# Patient Record
Sex: Female | Born: 1935 | Race: White | Hispanic: No | Smoking: Current every day smoker
Health system: Southern US, Community
[De-identification: ages and names within clinical notes are randomized; demographics above are authoritative.]

## PROBLEM LIST (undated history)

## (undated) DIAGNOSIS — E039 Hypothyroidism, unspecified: Secondary | ICD-10-CM

## (undated) DIAGNOSIS — I10 Essential (primary) hypertension: Secondary | ICD-10-CM

## (undated) DIAGNOSIS — R413 Other amnesia: Secondary | ICD-10-CM

## (undated) DIAGNOSIS — E785 Hyperlipidemia, unspecified: Secondary | ICD-10-CM

## (undated) DIAGNOSIS — Z720 Tobacco use: Secondary | ICD-10-CM

## (undated) DIAGNOSIS — J449 Chronic obstructive pulmonary disease, unspecified: Secondary | ICD-10-CM

## (undated) DIAGNOSIS — E538 Deficiency of other specified B group vitamins: Secondary | ICD-10-CM

## (undated) HISTORY — DX: Chronic obstructive pulmonary disease, unspecified: J44.9

## (undated) HISTORY — DX: Essential (primary) hypertension: I10

## (undated) HISTORY — DX: Other amnesia: R41.3

## (undated) HISTORY — DX: Hyperlipidemia, unspecified: E78.5

## (undated) HISTORY — PX: THYROIDECTOMY: SHX17

## (undated) HISTORY — DX: Hypothyroidism, unspecified: E03.9

## (undated) HISTORY — DX: Deficiency of other specified B group vitamins: E53.8

## (undated) HISTORY — DX: Tobacco use: Z72.0

---

## 1998-11-27 ENCOUNTER — Encounter: Payer: Self-pay | Admitting: Surgery

## 1998-11-28 ENCOUNTER — Inpatient Hospital Stay (HOSPITAL_COMMUNITY): Admission: RE | Admit: 1998-11-28 | Discharge: 1998-11-29 | Payer: Self-pay | Admitting: Surgery

## 2002-02-16 ENCOUNTER — Emergency Department (HOSPITAL_COMMUNITY): Admission: EM | Admit: 2002-02-16 | Discharge: 2002-02-17 | Payer: Self-pay | Admitting: Emergency Medicine

## 2002-02-16 ENCOUNTER — Encounter: Payer: Self-pay | Admitting: Emergency Medicine

## 2004-09-26 LAB — HM MAMMOGRAPHY

## 2004-09-30 ENCOUNTER — Encounter: Admission: RE | Admit: 2004-09-30 | Discharge: 2004-09-30 | Payer: Self-pay | Admitting: Family Medicine

## 2004-10-02 ENCOUNTER — Encounter: Admission: RE | Admit: 2004-10-02 | Discharge: 2004-10-02 | Payer: Self-pay | Admitting: Family Medicine

## 2011-01-16 ENCOUNTER — Encounter: Payer: Self-pay | Admitting: Family Medicine

## 2011-07-07 ENCOUNTER — Encounter: Payer: Self-pay | Admitting: Family Medicine

## 2011-07-07 DIAGNOSIS — I10 Essential (primary) hypertension: Secondary | ICD-10-CM | POA: Insufficient documentation

## 2011-07-07 DIAGNOSIS — E039 Hypothyroidism, unspecified: Secondary | ICD-10-CM | POA: Insufficient documentation

## 2013-05-28 ENCOUNTER — Other Ambulatory Visit: Payer: Self-pay | Admitting: Family Medicine

## 2014-04-22 ENCOUNTER — Ambulatory Visit: Payer: Self-pay | Admitting: Family Medicine

## 2014-10-10 ENCOUNTER — Encounter: Payer: Self-pay | Admitting: Family Medicine

## 2014-10-11 ENCOUNTER — Encounter: Payer: Self-pay | Admitting: Family Medicine

## 2014-10-14 ENCOUNTER — Other Ambulatory Visit: Payer: Medicare Other

## 2014-10-14 DIAGNOSIS — E039 Hypothyroidism, unspecified: Secondary | ICD-10-CM

## 2014-10-14 DIAGNOSIS — I10 Essential (primary) hypertension: Secondary | ICD-10-CM

## 2014-10-14 DIAGNOSIS — E785 Hyperlipidemia, unspecified: Secondary | ICD-10-CM

## 2014-10-14 DIAGNOSIS — Z79899 Other long term (current) drug therapy: Secondary | ICD-10-CM

## 2014-10-14 DIAGNOSIS — E119 Type 2 diabetes mellitus without complications: Secondary | ICD-10-CM

## 2014-10-14 LAB — COMPREHENSIVE METABOLIC PANEL
ALT: 9 U/L (ref 0–35)
AST: 22 U/L (ref 0–37)
Albumin: 4.2 g/dL (ref 3.5–5.2)
Alkaline Phosphatase: 64 U/L (ref 39–117)
BILIRUBIN TOTAL: 0.4 mg/dL (ref 0.2–1.2)
BUN: 15 mg/dL (ref 6–23)
CO2: 26 meq/L (ref 19–32)
CREATININE: 1.13 mg/dL — AB (ref 0.50–1.10)
Calcium: 8.9 mg/dL (ref 8.4–10.5)
Chloride: 105 mEq/L (ref 96–112)
Glucose, Bld: 104 mg/dL — ABNORMAL HIGH (ref 70–99)
Potassium: 4.7 mEq/L (ref 3.5–5.3)
Sodium: 141 mEq/L (ref 135–145)
Total Protein: 7.1 g/dL (ref 6.0–8.3)

## 2014-10-14 LAB — CBC WITH DIFFERENTIAL/PLATELET
BASOS PCT: 1 % (ref 0–1)
Basophils Absolute: 0.1 10*3/uL (ref 0.0–0.1)
EOS ABS: 0.1 10*3/uL (ref 0.0–0.7)
EOS PCT: 2 % (ref 0–5)
HCT: 44.4 % (ref 36.0–46.0)
Hemoglobin: 14.7 g/dL (ref 12.0–15.0)
LYMPHS ABS: 2.9 10*3/uL (ref 0.7–4.0)
Lymphocytes Relative: 44 % (ref 12–46)
MCH: 33 pg (ref 26.0–34.0)
MCHC: 33.1 g/dL (ref 30.0–36.0)
MCV: 99.6 fL (ref 78.0–100.0)
Monocytes Absolute: 0.3 10*3/uL (ref 0.1–1.0)
Monocytes Relative: 4 % (ref 3–12)
Neutro Abs: 3.2 10*3/uL (ref 1.7–7.7)
Neutrophils Relative %: 49 % (ref 43–77)
PLATELETS: 204 10*3/uL (ref 150–400)
RBC: 4.46 MIL/uL (ref 3.87–5.11)
RDW: 14 % (ref 11.5–15.5)
WBC: 6.5 10*3/uL (ref 4.0–10.5)

## 2014-10-14 LAB — LIPID PANEL
CHOL/HDL RATIO: 3.2 ratio
Cholesterol: 247 mg/dL — ABNORMAL HIGH (ref 0–200)
HDL: 77 mg/dL (ref >39)
LDL CALC: 142 mg/dL — AB (ref 0–99)
Triglycerides: 138 mg/dL (ref <150)
VLDL: 28 mg/dL (ref 0–40)

## 2014-10-14 LAB — HEMOGLOBIN A1C
Hgb A1c MFr Bld: 6.5 % — ABNORMAL HIGH (ref <5.7)
Mean Plasma Glucose: 140 mg/dL — ABNORMAL HIGH (ref <117)

## 2014-10-14 LAB — TSH: TSH: 57.086 u[IU]/mL — AB (ref 0.350–4.500)

## 2014-10-17 ENCOUNTER — Ambulatory Visit (INDEPENDENT_AMBULATORY_CARE_PROVIDER_SITE_OTHER): Payer: Medicare Other | Admitting: Family Medicine

## 2014-10-17 ENCOUNTER — Encounter: Payer: Self-pay | Admitting: Family Medicine

## 2014-10-17 VITALS — BP 180/100 | HR 80 | Temp 98.1°F | Resp 20 | Wt 151.0 lb

## 2014-10-17 DIAGNOSIS — Z0000 Encounter for general adult medical examination without abnormal findings: Secondary | ICD-10-CM

## 2014-10-17 DIAGNOSIS — Z23 Encounter for immunization: Secondary | ICD-10-CM

## 2014-10-17 DIAGNOSIS — E038 Other specified hypothyroidism: Secondary | ICD-10-CM

## 2014-10-17 DIAGNOSIS — E785 Hyperlipidemia, unspecified: Secondary | ICD-10-CM

## 2014-10-17 DIAGNOSIS — I10 Essential (primary) hypertension: Secondary | ICD-10-CM

## 2014-10-17 DIAGNOSIS — E119 Type 2 diabetes mellitus without complications: Secondary | ICD-10-CM

## 2014-10-17 DIAGNOSIS — IMO0001 Reserved for inherently not codable concepts without codable children: Secondary | ICD-10-CM

## 2014-10-17 DIAGNOSIS — J449 Chronic obstructive pulmonary disease, unspecified: Secondary | ICD-10-CM | POA: Insufficient documentation

## 2014-10-17 MED ORDER — SIMVASTATIN 20 MG PO TABS: 20.0000 mg | ORAL_TABLET | Freq: Every day | ORAL | Status: DC

## 2014-10-17 MED ORDER — TIOTROPIUM BROMIDE MONOHYDRATE 18 MCG IN CAPS: ORAL_CAPSULE | RESPIRATORY_TRACT | Status: DC

## 2014-10-17 MED ORDER — LEVOTHYROXINE SODIUM 88 MCG PO TABS: 88.0000 ug | ORAL_TABLET | Freq: Every day | ORAL | Status: DC

## 2014-10-17 NOTE — Addendum Note (Signed)
Addended by: Legrand RamsWILLIS, SANDY B on: 10/17/2014 01:00 PM   Modules accepted: Orders

## 2014-10-17 NOTE — Progress Notes (Signed)
Subjective:    Patient ID: Taylor Sawyer, female    DOB: 31-Jul-1930, 80 y.o.   MRN: [id number]  HPI Patient has not been seen in 2 years. She is coming today but of her daughters. One daughter has obtained power of attorney. The patient has not been on medication in over 2 years. She has a past medical history of diabetes mellitus type 2, hypertension, hyperlipidemia, hypothyroidism, and COPD. She continues to smoke. She is overdue for a mammogram, colonoscopy, and Pap smear. She is also due for Prevnar 13 as well as a flu shot. Patient unfortunately recently lost her home. She also recently lost a relative. She is very distraught and nervous today. Her family is convinced that this is the reason her blood pressure is elevated. I rechecked her blood pressure after she had sat for 30 minutes, and her blood pressure has fallen to 170/90 but still significantly elevated.  Her most recent lab work is listed below: Lab on 10/14/2014  Component Date Value Ref Range Status  . WBC 10/14/2014 6.5  4.0 - 10.5 K/uL Final  . RBC 10/14/2014 4.46  3.87 - 5.11 MIL/uL Final  . Hemoglobin 10/14/2014 14.7  12.0 - 15.0 g/dL Final  . HCT 10/14/2014 44.4  36.0 - 46.0 % Final  . MCV 10/14/2014 99.6  78.0 - 100.0 fL Final  . MCH 10/14/2014 33.0  26.0 - 34.0 pg Final  . MCHC 10/14/2014 33.1  30.0 - 36.0 g/dL Final  . RDW 10/14/2014 14.0  11.5 - 15.5 % Final  . Platelets 10/14/2014 204  150 - 400 K/uL Final  . Neutrophils Relative % 10/14/2014 49  43 - 77 % Final  . Neutro Abs 10/14/2014 3.2  1.7 - 7.7 K/uL Final  . Lymphocytes Relative 10/14/2014 44  12 - 46 % Final  . Lymphs Abs 10/14/2014 2.9  0.7 - 4.0 K/uL Final  . Monocytes Relative 10/14/2014 4  3 - 12 % Final  . Monocytes Absolute 10/14/2014 0.3  0.1 - 1.0 K/uL Final  . Eosinophils Relative 10/14/2014 2  0 - 5 % Final  . Eosinophils Absolute 10/14/2014 0.1  0.0 - 0.7 K/uL Final  . Basophils Relative 10/14/2014 1  0 - 1 % Final  . Basophils Absolute  10/14/2014 0.1  0.0 - 0.1 K/uL Final  . Smear Review 10/14/2014 Criteria for review not met   Final  . Sodium 10/14/2014 141  135 - 145 mEq/L Final  . Potassium 10/14/2014 4.7  3.5 - 5.3 mEq/L Final  . Chloride 10/14/2014 105  96 - 112 mEq/L Final  . CO2 10/14/2014 26  19 - 32 mEq/L Final  . Glucose, Bld 10/14/2014 104* 70 - 99 mg/dL Final  . BUN 10/14/2014 15  6 - 23 mg/dL Final  . Creat 10/14/2014 1.13* 0.50 - 1.10 mg/dL Final  . Total Bilirubin 10/14/2014 0.4  0.2 - 1.2 mg/dL Final  . Alkaline Phosphatase 10/14/2014 64  39 - 117 U/L Final  . AST 10/14/2014 22  0 - 37 U/L Final  . ALT 10/14/2014 9  0 - 35 U/L Final  . Total Protein 10/14/2014 7.1  6.0 - 8.3 g/dL Final  . Albumin 10/14/2014 4.2  3.5 - 5.2 g/dL Final  . Calcium 10/14/2014 8.9  8.4 - 10.5 mg/dL Final  . Hemoglobin A1C 10/14/2014 6.5* <5.7 % Final   Comment:  According to the ADA Clinical Practice Recommendations for 2011, when                          HbA1c is used as a screening test:                                                       >=6.5%   Diagnostic of Diabetes Mellitus                                     (if abnormal result is confirmed)                                                     5.7-6.4%   Increased risk of developing Diabetes Mellitus                                                     References:Diagnosis and Classification of Diabetes Mellitus,Diabetes                          ZYSA,6301,60(FUXNA 1):S62-S69 and Standards of Medical Care in                                  Diabetes - 2011,Diabetes TFTD,3220,25 (Suppl 1):S11-S61.                             . Mean Plasma Glucose 10/14/2014 140* <117 mg/dL Final  . Cholesterol 10/14/2014 247* 0 - 200 mg/dL Final   Comment: ATP III Classification:                                < 200        mg/dL        Desirable                               200 - 239      mg/dL        Borderline High                               >= 240        mg/dL        High                             . Triglycerides 10/14/2014 138  <150 mg/dL Final  . HDL 10/14/2014 77  >39 mg/dL Final  . Total CHOL/HDL Ratio 10/14/2014 3.2   Final  . VLDL 10/14/2014 28  0 - 40 mg/dL Final  .  LDL Cholesterol 10/14/2014 142* 0 - 99 mg/dL Final   Comment:                            Total Cholesterol/HDL Ratio:CHD Risk                                                 Coronary Heart Disease Risk Table                                                                 Men       Women                                   1/2 Average Risk              3.4        3.3                                       Average Risk              5.0        4.4                                    2X Average Risk              9.6        7.1                                    3X Average Risk             23.4       11.0                          Use the calculated Patient Ratio above and the CHD Risk table                           to determine the patient's CHD Risk.                          ATP III Classification (LDL):                                < 100        mg/dL         Optimal                               100 - 129     mg/dL  Near or Above Optimal                               130 - 159     mg/dL         Borderline High                               160 - 189     mg/dL         High                                > 190        mg/dL         Very High                             . TSH 10/14/2014 57.086* 0.350 - 4.500 uIU/mL Final   Past Medical History  Diagnosis Date  . Asthma   . Diabetes mellitus   . Hypertension   . Hypothyroidism   . Tobacco user   . Hyperlipidemia   . COPD (chronic obstructive pulmonary disease)    Past Surgical History  Procedure Laterality Date  . Thyroidectomy     Current Outpatient Prescriptions on File Prior to Visit  Medication Sig Dispense Refill  .  Fluticasone-Salmeterol (ADVAIR DISKUS) 250-50 MCG/DOSE AEPB Inhale 1 puff into the lungs every 12 (twelve) hours.        Marland Kitchen glipiZIDE (GLUCOTROL) 10 MG tablet Take 10 mg by mouth 2 (two) times daily before a meal.        . hydrochlorothiazide 25 MG tablet Take 25 mg by mouth daily.        Marland Kitchen HYDROcodone-acetaminophen (VICODIN) 5-500 MG per tablet Take 1 tablet by mouth every 6 (six) hours as needed.        Marland Kitchen lisinopril (PRINIVIL,ZESTRIL) 20 MG tablet Take 20 mg by mouth daily.        . metFORMIN (GLUMETZA) 1000 MG (MOD) 24 hr tablet Take 1,000 mg by mouth 2 (two) times daily with a meal.        . pioglitazone (ACTOS) 15 MG tablet Take 15 mg by mouth daily.        . traMADol (ULTRAM) 50 MG tablet Take 50 mg by mouth every 6 (six) hours as needed.         No current facility-administered medications on file prior to visit.   Allergies  Allergen Reactions  . Prednisone     Cramps.   History   Social History  . Marital Status: Widowed    Spouse Name: N/A    Number of Children: N/A  . Years of Education: N/A   Occupational History  . Not on file.   Social History Main Topics  . Smoking status: Current Every Day Smoker  . Smokeless tobacco: Not on file  . Alcohol Use: Not on file  . Drug Use: Not on file  . Sexual Activity: Not on file   Other Topics Concern  . Not on file   Social History Narrative  . No narrative on file   No family history on file.    Review of Systems  All other systems reviewed and are negative.      Objective:   Physical Exam  Vitals reviewed. Constitutional: She is oriented to person, place, and time. She appears well-developed and well-nourished. No distress.  HENT:  Head: Normocephalic and atraumatic.  Right Ear: External ear normal.  Left Ear: External ear normal.  Nose: Nose normal.  Mouth/Throat: Oropharynx is clear and moist. No oropharyngeal exudate.  Eyes: Conjunctivae and EOM are normal. Pupils are equal, round, and reactive to  light. Right eye exhibits no discharge. Left eye exhibits no discharge. No scleral icterus.  Neck: Normal range of motion. Neck supple. No JVD present. No thyromegaly present.  Cardiovascular: Normal rate, regular rhythm, normal heart sounds and intact distal pulses.   No murmur heard. Pulmonary/Chest: Effort normal. No respiratory distress. She has wheezes. She has no rales. She exhibits no tenderness.  Abdominal: Soft. Bowel sounds are normal. She exhibits no distension and no mass. There is no tenderness. There is no rebound and no guarding.  Musculoskeletal: Normal range of motion. She exhibits no edema and no tenderness.  Lymphadenopathy:    She has no cervical adenopathy.  Neurological: She is alert and oriented to person, place, and time. She has normal reflexes. She displays normal reflexes. No cranial nerve deficit. She exhibits normal muscle tone. Coordination normal.  Skin: Skin is warm. No rash noted. She is not diaphoretic. No erythema. No pallor.  Psychiatric: She has a normal mood and affect. Her behavior is normal. Judgment and thought content normal.          Assessment & Plan:  Routine general medical examination at a health care facility - Plan: MM Digital Screening  Essential hypertension  Diabetes mellitus type II, non insulin dependent  Other specified hypothyroidism  COPD bronchitis  HLD (hyperlipidemia)  Patient's blood pressure is extremely high. The family would like to try checking her blood pressure at home today. They will call me with the results later today or tomorrow. If her blood pressure remains elevated, I would resume hydrochlorothiazide along with losartan. I would then recheck blood pressure in one month. Patient's TSH is significantly elevated. Resume levothyroxine 88 mcg by mouth daily and recheck a TSH in 8 weeks. Hemoglobin A1c is acceptable. Patient currently is diet controlled and does not require medication. Her LDL cholesterol  significantly high. I will start the patient on simvastatin 20 mg by mouth daily and recheck a fasting lipid panel in 3 months. Patient will resume spiriva 1 inhalation daily.  I will also schedule patient for mammogram. Patient is 84 years on and has never had a bad Pap smear. Therefore according to current guidelines, she can discontinue Pap smears. She also does not want to have a colonoscopy given her age. The patient received the flu vaccine as well as Prevnar 13 today

## 2014-10-18 ENCOUNTER — Encounter: Payer: Self-pay | Admitting: *Deleted

## 2014-10-25 ENCOUNTER — Telehealth: Payer: Self-pay | Admitting: Family Medicine

## 2014-10-25 MED ORDER — LOSARTAN POTASSIUM-HCTZ 100-12.5 MG PO TABS: 1.0000 | ORAL_TABLET | Freq: Every day | ORAL | Status: DC

## 2014-10-25 NOTE — Telephone Encounter (Signed)
Begin hyzaar 100/12.5 poqday and recheck here in 2 weeks.

## 2014-10-25 NOTE — Telephone Encounter (Signed)
Patients poa is calling let us know that her blood pressure is still very high and would like to know what she should do [phone]

## 2014-10-25 NOTE — Telephone Encounter (Signed)
Pt is not currently taking any BP med - Pt's dtr is aware and [facility]

## 2014-11-07 ENCOUNTER — Ambulatory Visit (INDEPENDENT_AMBULATORY_CARE_PROVIDER_SITE_OTHER): Payer: Medicare Other | Admitting: Family Medicine

## 2014-11-07 ENCOUNTER — Encounter: Payer: Self-pay | Admitting: Family Medicine

## 2014-11-07 ENCOUNTER — Ambulatory Visit
Admission: RE | Admit: 2014-11-07 | Discharge: 2014-11-07 | Disposition: A | Discharge status: Home / Self Care | Payer: Medicare Other | Source: Ambulatory Visit | Attending: Family Medicine | Admitting: Family Medicine

## 2014-11-07 VITALS — BP 128/74 | HR 84 | Temp 98.3°F | Resp 16

## 2014-11-07 DIAGNOSIS — M25561 Pain in right knee: Secondary | ICD-10-CM

## 2014-11-07 DIAGNOSIS — I10 Essential (primary) hypertension: Secondary | ICD-10-CM
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
## 2014-11-08 ENCOUNTER — Telehealth: Payer: Self-pay | Admitting: Family Medicine

## 2014-11-08 NOTE — Telephone Encounter (Signed)
[phone]  Pt daughter is calling to get the xray results of her mothers knee    Pt is needing something for pain

## 2014-11-08 NOTE — Telephone Encounter (Signed)
[phone]  Pt daughter is calling to get results of xray And she states that her mother is needing something for pain

## 2014-11-08 NOTE — Telephone Encounter (Signed)
Pt's dtr aware - See imaging note

## 2014-11-14 ENCOUNTER — Ambulatory Visit: Payer: Self-pay

## 2014-12-03 ENCOUNTER — Telehealth: Payer: Self-pay | Admitting: *Deleted
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
## 2014-12-17 ENCOUNTER — Other Ambulatory Visit: Payer: Self-pay | Admitting: Family Medicine

## 2014-12-17 NOTE — Telephone Encounter (Signed)
Ok to refill??  Last office visit/ refill 11/07/2014. 

## 2014-12-17 NOTE — Telephone Encounter (Signed)
denied °

## 2014-12-23 ENCOUNTER — Encounter: Payer: Self-pay | Admitting: *Deleted

## 2014-12-23 NOTE — Telephone Encounter (Signed)
Denied.  NTBS if needs chronic pain meds.

## 2015-02-08 ENCOUNTER — Other Ambulatory Visit: Payer: Self-pay | Admitting: Family Medicine

## 2015-02-11 ENCOUNTER — Encounter: Payer: Self-pay | Admitting: Family Medicine

## 2015-02-17 ENCOUNTER — Ambulatory Visit: Payer: Self-pay | Admitting: Family Medicine

## 2015-02-18 ENCOUNTER — Ambulatory Visit: Payer: Self-pay | Admitting: Family Medicine

## 2015-02-25 ENCOUNTER — Other Ambulatory Visit: Payer: Self-pay | Admitting: Family Medicine

## 2015-02-27 ENCOUNTER — Ambulatory Visit: Payer: Self-pay | Admitting: Family Medicine

## 2015-03-12 ENCOUNTER — Encounter: Payer: Self-pay | Admitting: Family Medicine

## 2015-03-14 ENCOUNTER — Encounter: Payer: Self-pay | Admitting: Family Medicine

## 2015-03-14 ENCOUNTER — Other Ambulatory Visit: Payer: Self-pay | Admitting: Family Medicine

## 2015-03-14 ENCOUNTER — Ambulatory Visit (INDEPENDENT_AMBULATORY_CARE_PROVIDER_SITE_OTHER): Payer: Medicare Other | Admitting: Family Medicine

## 2015-03-14 VITALS — BP 146/70 | HR 68 | Temp 97.9°F | Resp 18 | Wt 158.0 lb

## 2015-03-14 DIAGNOSIS — E038 Other specified hypothyroidism: Secondary | ICD-10-CM

## 2015-03-14 DIAGNOSIS — I10 Essential (primary) hypertension: Secondary | ICD-10-CM

## 2015-03-14 DIAGNOSIS — E119 Type 2 diabetes mellitus without complications: Secondary | ICD-10-CM | POA: Diagnosis not present

## 2015-03-14 DIAGNOSIS — M25561 Pain in right knee: Secondary | ICD-10-CM

## 2015-03-14 LAB — COMPLETE METABOLIC PANEL WITH GFR
ALBUMIN: 3.8 g/dL (ref 3.5–5.2)
ALK PHOS: 77 U/L (ref 39–117)
ALT: 8 U/L (ref 0–35)
AST: 17 U/L (ref 0–37)
BILIRUBIN TOTAL: 0.3 mg/dL (ref 0.2–1.2)
BUN: 27 mg/dL — ABNORMAL HIGH (ref 6–23)
CO2: 26 meq/L (ref 19–32)
Calcium: 9.2 mg/dL (ref 8.4–10.5)
Chloride: 103 mEq/L (ref 96–112)
Creat: 1.13 mg/dL — ABNORMAL HIGH (ref 0.50–1.10)
GFR, EST NON AFRICAN AMERICAN: 45 mL/min — AB
GFR, Est African American: 52 mL/min — ABNORMAL LOW
GLUCOSE: 126 mg/dL — AB (ref 70–99)
POTASSIUM: 4.5 meq/L (ref 3.5–5.3)
Sodium: 140 mEq/L (ref 135–145)
Total Protein: 7.6 g/dL (ref 6.0–8.3)

## 2015-03-14 LAB — LIPID PANEL
Cholesterol: 175 mg/dL (ref 0–200)
HDL: 67 mg/dL (ref >=46)
LDL Cholesterol: 75 mg/dL (ref 0–99)
TRIGLYCERIDES: 167 mg/dL — AB (ref <150)
Total CHOL/HDL Ratio: 2.6 Ratio
VLDL: 33 mg/dL (ref 0–40)

## 2015-03-14 NOTE — Progress Notes (Signed)
Subjective:    Patient ID: Taylor Sawyer, female    DOB: 1930/09/23, 81 y.o.   MRN: [id number]  HPI Patient is here today for follow-up of her diabetes, hypertension, and hypothyroidism, and hyperlipidemia. Her blood pressure today is slightly elevated. However given her age a believe this is acceptable. They're not checking her blood pressure at home. She denies any chest pain shortness of breath or dyspnea on exertion. She denies any myalgias or right upper quadrant pain on simvastatin. She is due to check a fasting lipid panel. She is also due to recheck a TSH for hypothyroidism. When I last saw the patient in October, her TSH was almost 60. We started the patient on levothyroxine 88 g by mouth daily and she is due to recheck that today. She is also complaining of pain in her right knee. She has osteoarthritis of the right knee. She has a difficult time walking. Arthritis drugs and tramadol have been no benefit. Past Medical History  Diagnosis Date  . Asthma   . Diabetes mellitus   . Hypertension   . Hypothyroidism   . Tobacco user   . Hyperlipidemia   . COPD (chronic obstructive pulmonary disease)    Past Surgical History  Procedure Laterality Date  . Thyroidectomy     Current Outpatient Prescriptions on File Prior to Visit  Medication Sig Dispense Refill  . levothyroxine (SYNTHROID, LEVOTHROID) 88 MCG tablet TAKE 1 TABLET (88 MCG TOTAL) BY MOUTH DAILY. 30 tablet 3  . losartan-hydrochlorothiazide (HYZAAR) 100-12.5 MG per tablet TAKE 1 TABLET BY MOUTH DAILY. 30 tablet 5  . simvastatin (ZOCOR) 20 MG tablet TAKE 1 TABLET (20 MG TOTAL) BY MOUTH AT BEDTIME. 30 tablet 3  . tiotropium (SPIRIVA HANDIHALER) 18 MCG inhalation capsule USE 1 INHALATION ONCE DAILY AS INSTRUCTED 30 capsule 3   No current facility-administered medications on file prior to visit.   Allergies  Allergen Reactions  . Prednisone     Cramps.   History   Social History  . Marital Status: Widowed    Spouse  Name: N/A  . Number of Children: N/A  . Years of Education: N/A   Occupational History  . Not on file.   Social History Main Topics  . Smoking status: Current Every Day Smoker  . Smokeless tobacco: Not on file  . Alcohol Use: Not on file  . Drug Use: Not on file  . Sexual Activity: Not on file   Other Topics Concern  . Not on file   Social History Narrative      Review of Systems  All other systems reviewed and are negative.      Objective:   Physical Exam  Constitutional: She appears well-developed and well-nourished.  Neck: Neck supple. No thyromegaly present.  Cardiovascular: Normal rate, regular rhythm and normal heart sounds.   No murmur heard. Pulmonary/Chest: Effort normal and breath sounds normal. No respiratory distress. She has no wheezes. She has no rales. She exhibits no tenderness.  Abdominal: Soft. Bowel sounds are normal. She exhibits no distension and no mass. There is no tenderness. There is no rebound and no guarding.  Musculoskeletal: She exhibits no edema.       Right knee: She exhibits decreased range of motion. Tenderness found. Medial joint line and lateral joint line tenderness noted. No MCL and no LCL tenderness noted.  Lymphadenopathy:    She has no cervical adenopathy.  Vitals reviewed.         Assessment & Plan:  Other  specified hypothyroidism - Plan: TSH  Diabetes mellitus type II, controlled - Plan: COMPLETE METABOLIC PANEL WITH GFR, Lipid panel, Microalbumin, urine  Right knee pain  Benign essential HTN  I will check a TSH and titrate her levothyroxine to achieve a TSH in the therapeutic range. Her blood pressure is borderline today. I've asked him to check her blood pressure at home and if consistently greater than 145, I would add amlodipine. I will also check a fasting lipid panel. Goal LDL cholesterol is less than 100. I believe her right knee pain is due to osteoarthritis. Using sterile technique, I injected the right knee  with a mixture of 2 mL of lidocaine, 2 mL of Marcaine, and 2 mL of 40 mg per mL Kenalog. The patient tolerated procedure well without complication.

## 2015-03-15 LAB — TSH: TSH: 0.619 u[IU]/mL (ref 0.350–4.500)

## 2015-03-15 LAB — MICROALBUMIN, URINE: Microalb, Ur: 2 mg/dL (ref <2.0)

## 2015-03-17 LAB — HEMOGLOBIN A1C
Hgb A1c MFr Bld: 7.2 % — ABNORMAL HIGH (ref <5.7)
Mean Plasma Glucose: 160 mg/dL — ABNORMAL HIGH (ref <117)

## 2015-03-20 ENCOUNTER — Other Ambulatory Visit: Payer: Self-pay | Admitting: Family Medicine

## 2015-03-20 ENCOUNTER — Telehealth: Payer: Self-pay | Admitting: Family Medicine

## 2015-03-20 DIAGNOSIS — E1165 Type 2 diabetes mellitus with hyperglycemia: Secondary | ICD-10-CM

## 2015-03-20 DIAGNOSIS — IMO0002 Reserved for concepts with insufficient information to code with codable children: Secondary | ICD-10-CM

## 2015-03-20 MED ORDER — METFORMIN HCL 500 MG PO TABS: 500.0000 mg | ORAL_TABLET | Freq: Two times a day (BID) | ORAL | Status: DC

## 2015-03-20 NOTE — Telephone Encounter (Signed)
-----   Message from Donita BrooksWarren T Pickard, MD sent at 03/18/2015  7:15 AM EDT ----- Diabetes test is too high.  Begin metformin 500 bid. Recheck in 3 months.

## 2015-03-20 NOTE — Telephone Encounter (Signed)
LMTCB at pt home.  Rx to pharmacy

## 2015-03-20 NOTE — Telephone Encounter (Signed)
-----   Message from Donita BrooksWarren T Pickard, MD sent at 03/17/2015  6:59 AM EDT ----- Labs are excellent., Please have them add hga1c.

## 2015-04-23 ENCOUNTER — Other Ambulatory Visit: Payer: Self-pay | Admitting: Family Medicine

## 2015-04-23 DIAGNOSIS — IMO0002 Reserved for concepts with insufficient information to code with codable children: Secondary | ICD-10-CM

## 2015-04-23 DIAGNOSIS — E1165 Type 2 diabetes mellitus with hyperglycemia: Secondary | ICD-10-CM

## 2015-04-23 MED ORDER — SIMVASTATIN 20 MG PO TABS: ORAL_TABLET | ORAL | Status: DC

## 2015-04-23 MED ORDER — LEVOTHYROXINE SODIUM 88 MCG PO TABS: ORAL_TABLET | ORAL | Status: DC

## 2015-04-23 MED ORDER — LOSARTAN POTASSIUM-HCTZ 100-12.5 MG PO TABS: 1.0000 | ORAL_TABLET | Freq: Every day | ORAL | Status: DC

## 2015-04-23 MED ORDER — METFORMIN HCL 500 MG PO TABS: 500.0000 mg | ORAL_TABLET | Freq: Two times a day (BID) | ORAL | Status: DC

## 2015-06-03 ENCOUNTER — Other Ambulatory Visit: Payer: Self-pay | Admitting: Family Medicine

## 2015-06-03 NOTE — Telephone Encounter (Signed)
Refill appropriate and filled per protocol. 

## 2015-06-15 ENCOUNTER — Other Ambulatory Visit: Payer: Self-pay | Admitting: Family Medicine

## 2015-06-16 NOTE — Telephone Encounter (Signed)
Medication refilled per protocol. 

## 2015-07-10 ENCOUNTER — Ambulatory Visit: Payer: Medicare Other | Admitting: Family Medicine

## 2015-07-18 ENCOUNTER — Ambulatory Visit: Payer: Medicare Other | Admitting: Family Medicine

## 2015-07-24 ENCOUNTER — Ambulatory Visit: Payer: Medicare Other | Admitting: Family Medicine

## 2015-08-05 ENCOUNTER — Ambulatory Visit: Payer: Medicare Other | Admitting: Family Medicine

## 2015-08-16 ENCOUNTER — Other Ambulatory Visit: Payer: Self-pay | Admitting: Family Medicine

## 2015-08-26 ENCOUNTER — Ambulatory Visit (INDEPENDENT_AMBULATORY_CARE_PROVIDER_SITE_OTHER): Payer: Medicare Other | Admitting: Family Medicine

## 2015-08-26 ENCOUNTER — Encounter: Payer: Self-pay | Admitting: Family Medicine

## 2015-08-26 VITALS — BP 132/80 | HR 78 | Temp 98.4°F | Resp 16 | Wt 148.0 lb

## 2015-08-26 DIAGNOSIS — I10 Essential (primary) hypertension: Secondary | ICD-10-CM | POA: Diagnosis not present

## 2015-08-26 DIAGNOSIS — IMO0001 Reserved for inherently not codable concepts without codable children: Secondary | ICD-10-CM

## 2015-08-26 DIAGNOSIS — J449 Chronic obstructive pulmonary disease, unspecified: Secondary | ICD-10-CM

## 2015-08-26 DIAGNOSIS — E038 Other specified hypothyroidism: Secondary | ICD-10-CM

## 2015-08-26 DIAGNOSIS — E119 Type 2 diabetes mellitus without complications: Secondary | ICD-10-CM | POA: Diagnosis not present

## 2015-08-26 LAB — CBC WITH DIFFERENTIAL/PLATELET
BASOS ABS: 0.1 10*3/uL (ref 0.0–0.1)
Basophils Relative: 1 % (ref 0–1)
Eosinophils Absolute: 0.2 10*3/uL (ref 0.0–0.7)
Eosinophils Relative: 3 % (ref 0–5)
HEMATOCRIT: 38.7 % (ref 36.0–46.0)
Hemoglobin: 12.8 g/dL (ref 12.0–15.0)
LYMPHS ABS: 3 10*3/uL (ref 0.7–4.0)
Lymphocytes Relative: 40 % (ref 12–46)
MCH: 31.6 pg (ref 26.0–34.0)
MCHC: 33.1 g/dL (ref 30.0–36.0)
MCV: 95.6 fL (ref 78.0–100.0)
MPV: 9.3 fL (ref 8.6–12.4)
Monocytes Absolute: 0.5 10*3/uL (ref 0.1–1.0)
Monocytes Relative: 6 % (ref 3–12)
NEUTROS ABS: 3.8 10*3/uL (ref 1.7–7.7)
NEUTROS PCT: 50 % (ref 43–77)
PLATELETS: 240 10*3/uL (ref 150–400)
RBC: 4.05 MIL/uL (ref 3.87–5.11)
RDW: 13.9 % (ref 11.5–15.5)
WBC: 7.6 10*3/uL (ref 4.0–10.5)

## 2015-08-26 LAB — LIPID PANEL
Cholesterol: 130 mg/dL (ref 125–200)
HDL: 67 mg/dL (ref >=46)
LDL CALC: 43 mg/dL (ref <130)
TRIGLYCERIDES: 101 mg/dL (ref <150)
Total CHOL/HDL Ratio: 1.9 Ratio (ref <=5.0)
VLDL: 20 mg/dL (ref <30)

## 2015-08-26 LAB — COMPLETE METABOLIC PANEL WITH GFR
ALT: 4 U/L — AB (ref 6–29)
AST: 14 U/L (ref 10–35)
Albumin: 4 g/dL (ref 3.6–5.1)
Alkaline Phosphatase: 63 U/L (ref 33–130)
BILIRUBIN TOTAL: 0.3 mg/dL (ref 0.2–1.2)
BUN: 19 mg/dL (ref 7–25)
CALCIUM: 9 mg/dL (ref 8.6–10.4)
CO2: 25 mmol/L (ref 20–31)
Chloride: 106 mmol/L (ref 98–110)
Creat: 1.21 mg/dL — ABNORMAL HIGH (ref 0.60–0.88)
GFR, EST AFRICAN AMERICAN: 48 mL/min — AB (ref >=60)
GFR, Est Non African American: 41 mL/min — ABNORMAL LOW (ref >=60)
Glucose, Bld: 120 mg/dL — ABNORMAL HIGH (ref 70–99)
Potassium: 4.7 mmol/L (ref 3.5–5.3)
Sodium: 139 mmol/L (ref 135–146)
Total Protein: 6.7 g/dL (ref 6.1–8.1)

## 2015-08-26 LAB — TSH: TSH: 0.129 u[IU]/mL — ABNORMAL LOW (ref 0.350–4.500)

## 2015-08-26 LAB — HEMOGLOBIN A1C
HEMOGLOBIN A1C: 6 % — AB (ref <5.7)
Mean Plasma Glucose: 126 mg/dL — ABNORMAL HIGH (ref <117)

## 2015-08-26 NOTE — Progress Notes (Signed)
Subjective:    Patient ID: Taylor Sawyer, female    DOB: May 06, 1930, 81 y.o.   MRN: [id number]  HPI 03/12/15 Patient is here today for follow-up of her diabetes, hypertension, and hypothyroidism, and hyperlipidemia. Her blood pressure today is slightly elevated. However given her age a believe this is acceptable. They're not checking her blood pressure at home. She denies any chest pain shortness of breath or dyspnea on exertion. She denies any myalgias or right upper quadrant pain on simvastatin. She is due to check a fasting lipid panel. She is also due to recheck a TSH for hypothyroidism. When I last saw the patient in October, her TSH was almost 60. We started the patient on levothyroxine 88 g by mouth daily and she is due to recheck that today. She is also complaining of pain in her right knee. She has osteoarthritis of the right knee. She has a difficult time walking. Arthritis drugs and tramadol have been no benefit.  At that time, my plan was: I will check a TSH and titrate her levothyroxine to achieve a TSH in the therapeutic range. Her blood pressure is borderline today. I've asked him to check her blood pressure at home and if consistently greater than 145, I would add amlodipine. I will also check a fasting lipid panel. Goal LDL cholesterol is less than 100. I believe her right knee pain is due to osteoarthritis. Using sterile technique, I injected the right knee with a mixture of 2 mL of lidocaine, 2 mL of Marcaine, and 2 mL of 40 mg per mL Kenalog. The patient tolerated procedure well without complication.  08/26/15 She is here today for follow-up. Since I last saw her she has lost 10 pounds. This is been unintentional. Her daughter states that she just will need. Patient denies any cough. She denies any chest pain. She denies any hemoptysis. She denies any fevers or chills. She denies any abdominal pain. She denies any nausea vomiting or diarrhea area and she denies any blood in her stool.  Daughter states that she is just given up. Daughter states that she sleeps all day long. She will not be active. She will not even get up to walk around the house. Her blood pressure today is acceptable at 132/80. She continues to smoke and in fact may be smoking even more than she was previously. She denies any chest pain however. She is due to recheck her fasting lab work Past Medical History  Diagnosis Date  . Asthma   . Diabetes mellitus   . Hypertension   . Hypothyroidism   . Tobacco user   . Hyperlipidemia   . COPD (chronic obstructive pulmonary disease)    Past Surgical History  Procedure Laterality Date  . Thyroidectomy     Current Outpatient Prescriptions on File Prior to Visit  Medication Sig Dispense Refill  . levothyroxine (SYNTHROID, LEVOTHROID) 88 MCG tablet TAKE 1 TABLET (88 MCG TOTAL) BY MOUTH DAILY. 30 tablet 3  . losartan-hydrochlorothiazide (HYZAAR) 100-12.5 MG per tablet Take 1 tablet by mouth daily. 90 tablet 1  . metFORMIN (GLUCOPHAGE) 500 MG tablet TAKE 1 TABLET TWICE A DAY WITH A MEAL 60 tablet 2  . simvastatin (ZOCOR) 20 MG tablet TAKE 1 TABLET (20 MG TOTAL) BY MOUTH AT BEDTIME. 30 tablet 3  . tiotropium (SPIRIVA HANDIHALER) 18 MCG inhalation capsule USE 1 INHALATION ONCE DAILY AS INSTRUCTED 30 capsule 3   No current facility-administered medications on file prior to visit.   Allergies  Allergen Reactions  . Prednisone     Cramps.   Social History   Social History  . Marital Status: Widowed    Spouse Name: N/A  . Number of Children: N/A  . Years of Education: N/A   Occupational History  . Not on file.   Social History Main Topics  . Smoking status: Current Every Day Smoker  . Smokeless tobacco: Not on file  . Alcohol Use: Not on file  . Drug Use: Not on file  . Sexual Activity: Not on file   Other Topics Concern  . Not on file   Social History Narrative      Review of Systems  All other systems reviewed and are negative.        Objective:   Physical Exam  Constitutional: She appears well-developed and well-nourished.  Neck: Neck supple. No thyromegaly present.  Cardiovascular: Normal rate, regular rhythm and normal heart sounds.   No murmur heard. Pulmonary/Chest: Effort normal and breath sounds normal. No respiratory distress. She has no wheezes. She has no rales. She exhibits no tenderness.  Abdominal: Soft. Bowel sounds are normal. She exhibits no distension and no mass. There is no tenderness. There is no rebound and no guarding.  Musculoskeletal: She exhibits no edema.       Right knee: She exhibits decreased range of motion. Tenderness found. Medial joint line and lateral joint line tenderness noted. No MCL and no LCL tenderness noted.  Lymphadenopathy:    She has no cervical adenopathy.  Vitals reviewed.         Assessment & Plan:  Diabetes mellitus type II, non insulin dependent - Plan: CBC with Differential/Platelet, COMPLETE METABOLIC PANEL WITH GFR, Lipid panel, Hemoglobin A1c  Other specified hypothyroidism - Plan: TSH  Benign essential HTN  COPD bronchitis  I believe the weight loss is due to decreased caloric intake. At the present time her review of systems is negative for any concerning features for malignancy. However the patient continues to lose weight, I will begin our workup with a chest x-ray given her smoking history. I encouraged smoking cessation. Her blood pressures acceptable. I will check a TSH. I will also check a fasting lipid panel. Goal LDL cholesterol is less than 100. I will also check a hemoglobin A1c. Given her age, if I can keep her hemoglobin A1c less than 7, I will be very happy. I encouraged the patient to start being more active. To try to sleep during the evening and stay awake during the daytime. I've asked her to start getting up and walking around the house using her walker to try to keep her strength in her proximal muscles. I am concerned the patient may be  developing a mild early signs of dementia

## 2015-08-28 ENCOUNTER — Other Ambulatory Visit: Payer: Self-pay | Admitting: Family Medicine

## 2015-08-28 MED ORDER — LEVOTHYROXINE SODIUM 75 MCG PO TABS: 75.0000 ug | ORAL_TABLET | Freq: Every day | ORAL | Status: DC

## 2015-09-10 ENCOUNTER — Other Ambulatory Visit: Payer: Self-pay | Admitting: Family Medicine

## 2015-09-10 MED ORDER — METFORMIN HCL 500 MG PO TABS: ORAL_TABLET | ORAL | Status: DC

## 2015-09-27 ENCOUNTER — Other Ambulatory Visit: Payer: Self-pay | Admitting: Family Medicine

## 2015-10-01 ENCOUNTER — Other Ambulatory Visit: Payer: Self-pay | Admitting: Family Medicine

## 2015-10-01 MED ORDER — SIMVASTATIN 20 MG PO TABS: ORAL_TABLET | ORAL | Status: DC

## 2015-10-26 ENCOUNTER — Other Ambulatory Visit: Payer: Self-pay | Admitting: Family Medicine

## 2015-11-23 ENCOUNTER — Other Ambulatory Visit: Payer: Self-pay | Admitting: Family Medicine

## 2016-02-05 ENCOUNTER — Other Ambulatory Visit: Payer: Self-pay | Admitting: Family Medicine

## 2016-02-05 ENCOUNTER — Other Ambulatory Visit: Payer: Self-pay | Admitting: *Deleted

## 2016-02-05 MED ORDER — LOSARTAN POTASSIUM-HCTZ 100-12.5 MG PO TABS: 1.0000 | ORAL_TABLET | Freq: Every day | ORAL | Status: DC

## 2016-02-05 NOTE — Telephone Encounter (Signed)
Refill appropriate and filled per protocol. 

## 2016-02-05 NOTE — Telephone Encounter (Signed)
Received fax requesting refill on Losartan/ HCTZ.   Refill appropriate and filled per protocol. 

## 2016-02-28 ENCOUNTER — Other Ambulatory Visit: Payer: Self-pay | Admitting: Family Medicine

## 2016-03-13 ENCOUNTER — Other Ambulatory Visit: Payer: Self-pay | Admitting: Family Medicine

## 2016-03-29 ENCOUNTER — Other Ambulatory Visit: Payer: Self-pay | Admitting: Family Medicine

## 2016-04-09 ENCOUNTER — Encounter: Payer: Self-pay | Admitting: Family Medicine

## 2016-04-09 ENCOUNTER — Ambulatory Visit (INDEPENDENT_AMBULATORY_CARE_PROVIDER_SITE_OTHER): Payer: Medicare Other | Admitting: Family Medicine

## 2016-04-09 VITALS — BP 108/60 | HR 80 | Temp 97.9°F | Resp 20 | Wt 141.0 lb

## 2016-04-09 DIAGNOSIS — J441 Chronic obstructive pulmonary disease with (acute) exacerbation: Secondary | ICD-10-CM

## 2016-04-09 MED ORDER — PREDNISONE 20 MG PO TABS: ORAL_TABLET | ORAL | Status: DC

## 2016-04-09 MED ORDER — LEVOFLOXACIN 500 MG PO TABS: 500.0000 mg | ORAL_TABLET | Freq: Every day | ORAL | Status: DC

## 2016-04-09 NOTE — Progress Notes (Signed)
   Subjective:    Patient ID: Taylor Sawyer, female    DOB: 1930-05-21, 82 y.o.   MRN: [id number]  HPI  Patient has been sick for over a week. She has been coughing and wheezing. The cough is nonproductive. She denies any fevers. She denies any chills. She does report some shortness of breath. On examination today she has markedly diminished breath sounds bilaterally with expiratory wheezes bilaterally and right basilar rhonchi. Past Medical History  Diagnosis Date  . Asthma   . Diabetes mellitus   . Hypertension   . Hypothyroidism   . Tobacco user   . Hyperlipidemia   . COPD (chronic obstructive pulmonary disease) [facility](HCC)    Past Surgical History  Procedure Laterality Date  . Thyroidectomy     Current Outpatient Prescriptions on File Prior to Visit  Medication Sig Dispense Refill  . levothyroxine (SYNTHROID, LEVOTHROID) 75 MCG tablet TAKE 1 TABLET EVERY DAY 30 tablet 2  . losartan-hydrochlorothiazide (HYZAAR) 100-12.5 MG tablet Take 1 tablet by mouth daily. 90 tablet 1  . metFORMIN (GLUCOPHAGE) 500 MG tablet Take 1 tablet (500 mg total) by mouth 2 (two) times daily with a meal. Needs office visit and labs before further refills 60 tablet 0  . simvastatin (ZOCOR) 20 MG tablet TAKE 1 TABLET (20 MG TOTAL) BY MOUTH AT BEDTIME. 30 tablet 0  . tiotropium (SPIRIVA HANDIHALER) 18 MCG inhalation capsule USE 1 INHALATION ONCE DAILY AS INSTRUCTED 30 capsule 3   No current facility-administered medications on file prior to visit.   Allergies  Allergen Reactions  . Prednisone     Cramps.   Social History   Social History  . Marital Status: Widowed    Spouse Name: N/A  . Number of Children: N/A  . Years of Education: N/A   Occupational History  . Not on file.   Social History Main Topics  . Smoking status: Current Every Day Smoker  . Smokeless tobacco: Not on file  . Alcohol Use: Not on file  . Drug Use: Not on file  . Sexual Activity: Not on file   Other Topics Concern  . Not  on file   Social History Narrative     Review of Systems  All other systems reviewed and are negative.      Objective:   Physical Exam  HENT:  Right Ear: External ear normal.  Left Ear: External ear normal.  Nose: Nose normal.  Mouth/Throat: Oropharynx is clear and moist. No oropharyngeal exudate.  Eyes: Conjunctivae are normal.  Neck: Neck supple.  Cardiovascular: Normal rate, regular rhythm and normal heart sounds.   Pulmonary/Chest: Effort normal. She has wheezes. She has rhonchi in the right middle field and the right lower field.  Lymphadenopathy:    She has no cervical adenopathy.  Vitals reviewed.         Assessment & Plan:  COPD exacerbation (HCC) - Plan: predniSONE (DELTASONE) 20 MG tablet, levofloxacin (LEVAQUIN) 500 MG tablet  The patient is having a COPD exacerbation. Begin Levaquin 500 mg by mouth daily for 7 days. Begin prednisone taper pack. Also recommended that the patient discontinue smoking. Check next week if no better or sooner if worse

## 2016-04-12 ENCOUNTER — Telehealth: Payer: Self-pay | Admitting: Family Medicine

## 2016-04-12 NOTE — Telephone Encounter (Signed)
Prednisone has causes her to have severe cramping.  Prednisone is on allergy list.  Told to stop Prednisone, push fluids.  DO complete antibiotics.

## 2016-04-14 ENCOUNTER — Telehealth: Payer: Self-pay | Admitting: Family Medicine

## 2016-04-14 MED ORDER — TIOTROPIUM BROMIDE MONOHYDRATE 18 MCG IN CAPS: ORAL_CAPSULE | RESPIRATORY_TRACT | Status: DC

## 2016-04-14 NOTE — Telephone Encounter (Signed)
Medication called/sent to requested pharmacy  

## 2016-04-14 NOTE — Telephone Encounter (Signed)
Pt's daughter is calling to see if we can call in a refill of Spiriva called in to the [facility] in [place].  Rinaldo [phone]

## 2016-04-19 ENCOUNTER — Telehealth: Payer: Self-pay | Admitting: Family Medicine

## 2016-04-19 ENCOUNTER — Other Ambulatory Visit: Payer: Self-pay | Admitting: Family Medicine

## 2016-04-19 MED ORDER — FIRST-DUKES MOUTHWASH MT SUSP: OROMUCOSAL | Status: DC

## 2016-04-19 NOTE — Telephone Encounter (Signed)
Could she have thrush? I did put her on prednisone. If there is no thrush, I would try Magic mouthwash 1 teaspoon every 6 hours as needed. Obviously if there is thrush, we will need to treat the thrush with nystatin.

## 2016-04-19 NOTE — Telephone Encounter (Signed)
Called and spoke to daughter and there is no white areas on the inside of her mouth just red, swollen and painful ulcers. Med sent to pharm and pt's daughter aware.

## 2016-04-19 NOTE — Telephone Encounter (Signed)
[phone] patient calling to say that the levofloxacin that was prescribed is irritating her mouth  Please call and advise what she should do

## 2016-04-24 ENCOUNTER — Other Ambulatory Visit: Payer: Self-pay | Admitting: Family Medicine

## 2016-04-29 ENCOUNTER — Ambulatory Visit: Payer: Medicare Other | Admitting: Family Medicine

## 2016-05-04 ENCOUNTER — Telehealth: Payer: Self-pay | Admitting: Family Medicine

## 2016-05-04 NOTE — Telephone Encounter (Signed)
Call placed to patient and patient daughter made aware.   Appointment scheduled.

## 2016-05-04 NOTE — Telephone Encounter (Signed)
Patient's daughter, Taylor Sawyer called requesting that Dr. Tanya NonesPickard write a letter for their attourney stating that she is mentally competent and able to speak in court.  She would like it faxed attn: [name] @ [phone]

## 2016-05-04 NOTE — Telephone Encounter (Signed)
MD please advise

## 2016-05-04 NOTE — Telephone Encounter (Signed)
She should come in for mmse to rule out dementia prior to level.

## 2016-05-06 ENCOUNTER — Encounter: Payer: Self-pay | Admitting: Family Medicine

## 2016-05-06 ENCOUNTER — Other Ambulatory Visit: Payer: Self-pay | Admitting: Family Medicine

## 2016-05-06 ENCOUNTER — Ambulatory Visit (INDEPENDENT_AMBULATORY_CARE_PROVIDER_SITE_OTHER): Payer: Medicare Other | Admitting: Family Medicine

## 2016-05-06 VITALS — BP 120/74 | HR 86 | Temp 98.1°F | Resp 18 | Wt 136.0 lb

## 2016-05-06 DIAGNOSIS — R413 Other amnesia: Secondary | ICD-10-CM | POA: Diagnosis not present

## 2016-05-06 DIAGNOSIS — H538 Other visual disturbances: Secondary | ICD-10-CM

## 2016-05-06 NOTE — Progress Notes (Signed)
Subjective:    Patient ID: Taylor Sawyer, female    DOB: 1930-01-23, 82 y.o.   MRN: [id number]  HPI Patient is a pleasant 82 year old white female with a past medical history of COPD, diabetes mellitus, hypertension, and hypothyroidism. She is here today with her daughter. Her daughter is trying to become her power of attorney. At present, her other daughter, Babette Relic, is the power of attorney. The patient has been staying with her present daughter for 18 months.  Today they bring in several bank statements dating as far back as 18 months showing withdrawals being made from the patient's bank account assumedly by [name] that is not being spent on the patient. Therefore they want to be made the power of attorney so they can oversee her finances and use her finances to care for the patient. She is staying with them 24/7.  When I asked the patient, she clearly states that she is here in May 2017 on a Thursday although she is confused as to the day. She knows the location as a doctor's office and [facility] although she cannot remember the town of [place]. She knows who I am. She states that she wants her other daughter to be changed to her power of attorney. She states that she wants her to be made the power of attorney because she wants her to have access to her money to help pay for her expenses and care for her. The patient is not aware that [name] has been taking money from her account. This is because [name] has changed the address of the account so that the bank statements are no longer coming to the patient.  On Mini-Mental status exam it is clear that the patient has moderate dementia. She is able to recall 3 out of 3 objects. However she is unable to spell world backwards or perform serial sevens. She is unable to perform the clock drawing test. She does draw a clock but the numbers are incorrectly placed and she is unable to draw the appropriate hand placement on the clock. Past  Medical History  Diagnosis Date  . Asthma   . Diabetes mellitus   . Hypertension   . Hypothyroidism   . Tobacco user   . Hyperlipidemia   . COPD (chronic obstructive pulmonary disease) [facility])    Past Surgical History  Procedure Laterality Date  . Thyroidectomy     Current Outpatient Prescriptions on File Prior to Visit  Medication Sig Dispense Refill  . Diphenhyd-Hydrocort-Nystatin (FIRST-DUKES MOUTHWASH) SUSP 5 ml po - swish and swallow q 6 hours (Use your formulation) 237 mL 0  . levothyroxine (SYNTHROID, LEVOTHROID) 75 MCG tablet TAKE 1 TABLET EVERY DAY 30 tablet 2  . losartan-hydrochlorothiazide (HYZAAR) 100-12.5 MG tablet Take 1 tablet by mouth daily. 90 tablet 1  . metFORMIN (GLUCOPHAGE) 500 MG tablet Take 1 tablet (500 mg total) by mouth 2 (two) times daily with a meal. Needs office visit and labs before further refills 60 tablet 0  . simvastatin (ZOCOR) 20 MG tablet TAKE 1 TABLET (20 MG TOTAL) BY MOUTH AT BEDTIME. 30 tablet 5  . tiotropium (SPIRIVA HANDIHALER) 18 MCG inhalation capsule USE 1 INHALATION ONCE DAILY AS INSTRUCTED 30 capsule 3   No current facility-administered medications on file prior to visit.   Allergies  Allergen Reactions  . Prednisone     Cramps.   Social History   Social History  . Marital Status: Widowed    Spouse Name: N/A  .  Number of Children: N/A  . Years of Education: N/A   Occupational History  . Not on file.   Social History Main Topics  . Smoking status: Current Every Day Smoker  . Smokeless tobacco: Not on file  . Alcohol Use: Not on file  . Drug Use: Not on file  . Sexual Activity: Not on file   Other Topics Concern  . Not on file   Social History Narrative      Review of Systems  All other systems reviewed and are negative.      Objective:   Physical Exam  Constitutional: She is oriented to person, place, and time.  Cardiovascular: Normal rate and regular rhythm.   Pulmonary/Chest: Effort normal and breath sounds  normal.  Neurological: She is alert and oriented to person, place, and time. No cranial nerve deficit. She exhibits normal muscle tone. Coordination normal.  Psychiatric: She has a normal mood and affect. Her speech is normal and behavior is normal. Judgment and thought content normal. Her mood appears not anxious. Her affect is not angry, not labile and not inappropriate. She is not slowed and not withdrawn. Cognition and memory are impaired. She does not express impulsivity or inappropriate judgment. She does not exhibit a depressed mood.  Vitals reviewed.         Assessment & Plan:  Patient does have moderate dementia. Mini-Mental status exam today she scores 23 out of 30. However I still believe she has the appropriate judgment to make legal and medical decisions.  I do believe that she can be easily taken advantage of and persuaded. However the evidence demonstrates that presently her finances are not being spent appropriately on her for her care. Therefore I believe her request to change her power of attorney is appropriate and I believe she has the appropriate insight and judgment to know to make that change

## 2016-05-25 ENCOUNTER — Other Ambulatory Visit: Payer: Self-pay | Admitting: Family Medicine

## 2016-07-29 ENCOUNTER — Other Ambulatory Visit: Payer: Self-pay | Admitting: Family Medicine

## 2016-07-29 NOTE — Telephone Encounter (Signed)
Refill appropriate and filled per protocol. 

## 2016-11-05 ENCOUNTER — Other Ambulatory Visit: Payer: Self-pay | Admitting: Family Medicine

## 2016-12-15 ENCOUNTER — Other Ambulatory Visit: Payer: Self-pay | Admitting: Family Medicine

## 2016-12-15 MED ORDER — METFORMIN HCL 500 MG PO TABS: 500.0000 mg | ORAL_TABLET | Freq: Two times a day (BID) | ORAL | 2 refills | Status: DC

## 2016-12-16 ENCOUNTER — Other Ambulatory Visit: Payer: Self-pay | Admitting: *Deleted

## 2016-12-16 MED ORDER — METFORMIN HCL 500 MG PO TABS: 500.0000 mg | ORAL_TABLET | Freq: Two times a day (BID) | ORAL | 1 refills | Status: DC

## 2016-12-16 MED ORDER — LOSARTAN POTASSIUM-HCTZ 100-12.5 MG PO TABS: 1.0000 | ORAL_TABLET | Freq: Every day | ORAL | 1 refills | Status: DC

## 2016-12-16 NOTE — Telephone Encounter (Signed)
Received call from patient daughter.   Reports that refills on Metformin and Hyzaar were for wrong quantity.   Requested 90 day supply.   Prescription sent to pharmacy.   Call placed to patient. No answer. No VM.

## 2017-03-21 ENCOUNTER — Encounter: Payer: Self-pay | Admitting: Family Medicine

## 2017-03-21 ENCOUNTER — Other Ambulatory Visit: Payer: Self-pay | Admitting: Family Medicine

## 2017-03-21 MED ORDER — SIMVASTATIN 20 MG PO TABS: ORAL_TABLET | ORAL | 0 refills | Status: DC

## 2017-04-06 ENCOUNTER — Other Ambulatory Visit: Payer: Self-pay | Admitting: Family Medicine

## 2017-04-06 MED ORDER — METFORMIN HCL 500 MG PO TABS: 500.0000 mg | ORAL_TABLET | Freq: Two times a day (BID) | ORAL | 1 refills | Status: DC

## 2017-04-06 NOTE — Telephone Encounter (Signed)
Medication called/sent to requested pharmacy  

## 2017-06-23 ENCOUNTER — Other Ambulatory Visit: Payer: Self-pay | Admitting: Family Medicine

## 2017-06-23 MED ORDER — SIMVASTATIN 20 MG PO TABS: ORAL_TABLET | ORAL | 0 refills | Status: DC

## 2017-06-30 ENCOUNTER — Ambulatory Visit: Payer: Medicare Other | Admitting: Family Medicine

## 2017-07-05 ENCOUNTER — Encounter: Payer: Self-pay | Admitting: Family Medicine

## 2017-07-05 ENCOUNTER — Ambulatory Visit (INDEPENDENT_AMBULATORY_CARE_PROVIDER_SITE_OTHER): Payer: Medicare Other | Admitting: Family Medicine

## 2017-07-05 VITALS — BP 130/70 | HR 78 | Temp 98.3°F | Resp 14 | Wt 138.0 lb

## 2017-07-05 DIAGNOSIS — E119 Type 2 diabetes mellitus without complications: Secondary | ICD-10-CM

## 2017-07-05 DIAGNOSIS — M2012 Hallux valgus (acquired), left foot: Secondary | ICD-10-CM

## 2017-07-05 DIAGNOSIS — M2011 Hallux valgus (acquired), right foot: Secondary | ICD-10-CM

## 2017-07-05 DIAGNOSIS — L608 Other nail disorders: Secondary | ICD-10-CM | POA: Diagnosis not present

## 2017-07-05 DIAGNOSIS — I10 Essential (primary) hypertension: Secondary | ICD-10-CM

## 2017-07-05 DIAGNOSIS — E039 Hypothyroidism, unspecified: Secondary | ICD-10-CM

## 2017-07-05 DIAGNOSIS — J449 Chronic obstructive pulmonary disease, unspecified: Secondary | ICD-10-CM

## 2017-07-05 LAB — CBC WITH DIFFERENTIAL/PLATELET
BASOS PCT: 1 %
Basophils Absolute: 81 cells/uL (ref 0–200)
EOS ABS: 324 {cells}/uL (ref 15–500)
Eosinophils Relative: 4 %
HEMATOCRIT: 36.8 % (ref 35.0–45.0)
Hemoglobin: 11.8 g/dL — ABNORMAL LOW (ref 12.0–15.0)
Lymphocytes Relative: 39 %
Lymphs Abs: 3159 cells/uL (ref 850–3900)
MCH: 32.2 pg (ref 27.0–33.0)
MCHC: 32.1 g/dL (ref 32.0–36.0)
MCV: 100.5 fL — ABNORMAL HIGH (ref 80.0–100.0)
MONO ABS: 567 {cells}/uL (ref 200–950)
MONOS PCT: 7 %
MPV: 9.9 fL (ref 7.5–12.5)
NEUTROS ABS: 3969 {cells}/uL (ref 1500–7800)
Neutrophils Relative %: 49 %
Platelets: 227 10*3/uL (ref 140–400)
RBC: 3.66 MIL/uL — AB (ref 3.80–5.10)
RDW: 14.2 % (ref 11.0–15.0)
WBC: 8.1 10*3/uL (ref 3.8–10.8)

## 2017-07-05 NOTE — Progress Notes (Signed)
Subjective:    Patient ID: Taylor BalesSusie C Sawyer, female    DOB: 20-Feb-1930, 83 y.o.   MRN: [id number]  HPI  04/2016 Patient is a pleasant 83 year old white female with a past medical history of COPD, diabetes mellitus, hypertension, and hypothyroidism. She is here today with her daughter. Her daughter is trying to become her power of attorney. At present, her other daughter, Taylor Sawyer, is the power of attorney. The patient has been staying with her present daughter for 18 months.  Today they bring in several bank statements dating as far back as 18 months showing withdrawals being made from the patient's bank account assumedly by Taylor Sawyer that is not being spent on the patient. Therefore they want to be made the power of attorney so they can oversee her finances and use her finances to care for the patient. She is staying with them 24/7.  When I asked the patient, she clearly states that she is here in May 2017 on a Thursday although she is confused as to the day. She [place] the [place] [facility] [place] although she cannot remember the town of [place]. She knows who I am. She states that she wants her other daughter to be changed to her power of attorney. She states that she wants her to be made the power of attorney because she wants her to have access to her money to help pay for her expenses and care for her. The patient is not aware that Taylor Sawyer has been taking money from her account. This is because Taylor Sawyer has changed the address of the account so that the bank statements are no longer coming to the patient.  On Mini-Mental status exam it is clear that the patient has moderate dementia. She is able to recall 3 out of 3 objects. However she is unable to spell world backwards or perform serial sevens. She is unable to perform the clock drawing test. She does draw a clock but the numbers are incorrectly placed and she is unable to draw the appropriate hand placement on the clock.   At that time, my plan was: Patient does have moderate dementia. Mini-Mental status exam today she scores 23 out of 30. However I still believe she has the appropriate judgment to make legal and medical decisions.  I do believe that she can be easily taken advantage of and persuaded. However the evidence demonstrates that presently her finances are not being spent appropriately on her for her care. Therefore I believe her request to change her power of attorney is appropriate and I believe she has the appropriate insight and judgment to know to make that change  07/05/17 The patient has not been seen since.  She has not had lab work since 07/2015.  She is overdue for a foot exam. On examination today, the patient has a hammertoe on her right foot.  She also has pronounced hallux valgus on both feet.  She also has thick, dysmorphic, long twisted toenails actually growing into the skin on the adjacent toes. She would benefit from seeing a podiatrist for these reasons. Her blood pressure today is adequately controlled. She denies any chest pain or shortness of breath. She is still smoking. She has faint Lexapro wheezes bilaterally. They're not checking her sugars. They deny any symptoms of hypoglycemia. She denies any polyuria, polydipsia, or blurred vision. She refuses to see an eye doctor. She is scheduled and missed 2 separate appointments. She is long overdue for  fasting lab work Past Medical History:  Diagnosis Date  . Asthma   . COPD (chronic obstructive pulmonary disease) (HCC)   . Diabetes mellitus   . Hyperlipidemia   . Hypertension   . Hypothyroidism   . Tobacco user    Past Surgical History:  Procedure Laterality Date  . THYROIDECTOMY     Current Outpatient Prescriptions on File Prior to Visit  Medication Sig Dispense Refill  . Diphenhyd-Hydrocort-Nystatin (FIRST-DUKES MOUTHWASH) SUSP 5 ml po - swish and swallow q 6 hours (Use your formulation) 237 mL 0  . levothyroxine (SYNTHROID,  LEVOTHROID) 75 MCG tablet TAKE ONE TABLET BY MOUTH ONCE DAILY 30 tablet 6  . losartan-hydrochlorothiazide (HYZAAR) 100-12.5 MG tablet Take 1 tablet by mouth daily. 90 tablet 1  . metFORMIN (GLUCOPHAGE) 500 MG tablet Take 1 tablet (500 mg total) by mouth 2 (two) times daily with a meal. 180 tablet 1  . simvastatin (ZOCOR) 20 MG tablet TAKE 1 TABLET (20 MG TOTAL) BY MOUTH AT BEDTIME. 30 tablet 0  . tiotropium (SPIRIVA HANDIHALER) 18 MCG inhalation capsule USE 1 INHALATION ONCE DAILY AS INSTRUCTED 30 capsule 3   No current facility-administered medications on file prior to visit.    Allergies  Allergen Reactions  . Prednisone     Cramps.   Social History   Social History  . Marital status: Widowed    Spouse name: N/A  . Number of children: N/A  . Years of education: N/A   Occupational History  . Not on file.   Social History Main Topics  . Smoking status: Current Every Day Smoker  . Smokeless tobacco: Not on file  . Alcohol use Not on file  . Drug use: Unknown  . Sexual activity: Not on file   Other Topics Concern  . Not on file   Social History Narrative  . No narrative on file      Review of Systems  All other systems reviewed and are negative.      Objective:   Physical Exam  Constitutional: She is oriented to person, place, and time.  Cardiovascular: Normal rate and regular rhythm.   Pulmonary/Chest: Effort normal and breath sounds normal.  Neurological: She is alert and oriented to person, place, and time. No cranial nerve deficit. She exhibits normal muscle tone. Coordination normal.  Psychiatric: She has a normal mood and affect. Her speech is normal and behavior is normal. Judgment and thought content normal. Her mood appears not anxious. Her affect is not angry, not labile and not inappropriate. She is not slowed and not withdrawn. Cognition and memory are impaired. She does not express impulsivity or inappropriate judgment. She does not exhibit a depressed  mood.  Vitals reviewed.         Assessment & Plan:  Essential hypertension  Hypothyroidism, unspecified type - Plan: TSH  Chronic obstructive pulmonary disease, unspecified COPD type (HCC)  Controlled type 2 diabetes mellitus without complication, without long-term current use of insulin (HCC) - Plan: CBC with Differential/Platelet, COMPLETE METABOLIC PANEL WITH GFR, Lipid panel, Hemoglobin A1c  Her blood pressure today is well controlled. I will make no changes in her medication. I will schedule the patient to see a podiatrist given the deformities in her feet, her dysmorphic toenails, and her history of diabetes. Her COPD seems stable. I continue to encourage smoking cessation. I will check a TSH to ensure adequate control of her hypothyroidism. I will check a hemoglobin A1c to ensure adequate glycemic control. I will check a fasting  lipid panel. Goal LDL cholesterol is less than 100. I encouraged her daughter is her power of attorney. The patient needs to be seen every 6 months for regular lab work.

## 2017-07-06 LAB — COMPLETE METABOLIC PANEL WITH GFR
ALK PHOS: 69 U/L (ref 33–130)
ALT: 4 U/L — ABNORMAL LOW (ref 6–29)
AST: 14 U/L (ref 10–35)
Albumin: 4 g/dL (ref 3.6–5.1)
BUN: 25 mg/dL (ref 7–25)
CALCIUM: 8.8 mg/dL (ref 8.6–10.4)
CO2: 20 mmol/L (ref 20–31)
Chloride: 108 mmol/L (ref 98–110)
Creat: 1.51 mg/dL — ABNORMAL HIGH (ref 0.60–0.88)
GFR, EST NON AFRICAN AMERICAN: 32 mL/min — AB (ref >=60)
GFR, Est African American: 36 mL/min — ABNORMAL LOW (ref >=60)
Glucose, Bld: 89 mg/dL (ref 70–99)
POTASSIUM: 4.4 mmol/L (ref 3.5–5.3)
SODIUM: 141 mmol/L (ref 135–146)
Total Bilirubin: 0.3 mg/dL (ref 0.2–1.2)
Total Protein: 6.9 g/dL (ref 6.1–8.1)

## 2017-07-06 LAB — LIPID PANEL
CHOL/HDL RATIO: 2.2 ratio (ref <5.0)
CHOLESTEROL: 166 mg/dL (ref <200)
HDL: 75 mg/dL (ref >50)
LDL Cholesterol: 67 mg/dL (ref <100)
Triglycerides: 119 mg/dL (ref <150)
VLDL: 24 mg/dL (ref <30)

## 2017-07-06 LAB — HEMOGLOBIN A1C
Hgb A1c MFr Bld: 5.7 % — ABNORMAL HIGH (ref <5.7)
Mean Plasma Glucose: 117 mg/dL

## 2017-07-06 LAB — TSH: TSH: 2.43 mIU/L

## 2017-07-08 ENCOUNTER — Other Ambulatory Visit: Payer: Self-pay | Admitting: Family Medicine

## 2017-07-08 MED ORDER — LOSARTAN POTASSIUM 100 MG PO TABS: 100.0000 mg | ORAL_TABLET | Freq: Every day | ORAL | 3 refills | Status: DC

## 2017-07-26 ENCOUNTER — Ambulatory Visit: Payer: Medicare Other | Admitting: Podiatry

## 2017-07-31 ENCOUNTER — Other Ambulatory Visit: Payer: Self-pay | Admitting: Family Medicine

## 2017-08-02 ENCOUNTER — Ambulatory Visit (INDEPENDENT_AMBULATORY_CARE_PROVIDER_SITE_OTHER): Payer: Medicare Other | Admitting: Podiatry

## 2017-08-02 DIAGNOSIS — M79675 Pain in left toe(s): Secondary | ICD-10-CM

## 2017-08-02 DIAGNOSIS — M79674 Pain in right toe(s): Secondary | ICD-10-CM | POA: Diagnosis not present

## 2017-08-02 DIAGNOSIS — B351 Tinea unguium: Secondary | ICD-10-CM | POA: Diagnosis not present

## 2017-08-02 NOTE — Progress Notes (Signed)
Subjective:    Patient ID: Taylor Sawyer, female   DOB: 83 y.o.   MRN: [id number]   HPI 83 year old female presents the office today for concerns of thick, painful, elongated toenails that she cannot trim herself. Denies any swelling or redness or drainage and the toenail sites. She states the nails are long causing pressure to the other digits. She presents today with her daughter. No other concerns today.    Review of Systems  All other systems reviewed and are negative.       Objective:  Physical Exam General: AAO x3, NAD  Dermatological: Nails are hypertrophic, dystrophic, brittle, discolored, elongated 10. No surrounding redness or drainage. Tenderness nails 1-5 bilaterally. No open lesions or pre-ulcerative lesions are identified today. Mild dryness to the plantar aspect of the foot that her daughter has been applying moisturizer on.   Vascular: Dorsalis Pedis 2/4 and Posterior Tibial artery pedal pulses are 1/4 bilateral with immedate capillary fill time. There is no pain with calf compression, swelling, warmth, erythema.   Neruologic: Grossly intact via light touch bilateral.  Protective threshold with Semmes Wienstein monofilament intact to all pedal sites bilateral.   Musculoskeletal: No gross boney pedal deformities bilateral. No pain, crepitus, or limitation noted with foot and ankle range of motion bilateral. Muscular strength 5/5 in all groups tested bilateral.    Assessment:     83 year old female symptomatic onychomycosis    Plan:     -Treatment options discussed including all alternatives, risks, and complications -Etiology of symptoms were discussed -Nails debrided 10 without complications or bleeding. -Daily foot inspection -Follow-up in 3 months or sooner if any problems arise. In the meantime, encouraged to call the office with any questions, concerns, change in symptoms.   Ovid CurdMatthew Kraig Genis, DPM

## 2017-08-10 ENCOUNTER — Other Ambulatory Visit: Payer: Self-pay | Admitting: Family Medicine

## 2017-08-16 ENCOUNTER — Telehealth: Payer: Self-pay | Admitting: Family Medicine

## 2017-08-16 NOTE — Telephone Encounter (Signed)
Pt's levothyroxine was recalled what do you need to about this?  Call Pam [phone]

## 2017-08-17 NOTE — Telephone Encounter (Signed)
Called and spoke to [facility] and the brand that they carry has not been recalled so pt is ok to continue to take. Pam aware via vm.

## 2017-08-17 NOTE — Telephone Encounter (Signed)
Switch to a different version (manufacturer) at the same dose and recheck TSH in 8 weeks.   Levoxyl 75 mcg poqd

## 2017-10-03 ENCOUNTER — Encounter: Payer: Self-pay | Admitting: Family Medicine

## 2017-10-04 ENCOUNTER — Ambulatory Visit: Payer: Medicare Other | Admitting: Family Medicine

## 2017-10-13 ENCOUNTER — Ambulatory Visit (INDEPENDENT_AMBULATORY_CARE_PROVIDER_SITE_OTHER): Payer: Medicare Other | Admitting: Family Medicine

## 2017-10-13 ENCOUNTER — Encounter: Payer: Self-pay | Admitting: Family Medicine

## 2017-10-13 VITALS — BP 138/88 | HR 76 | Temp 97.7°F | Resp 20 | Wt 138.0 lb

## 2017-10-13 DIAGNOSIS — J440 Chronic obstructive pulmonary disease with acute lower respiratory infection: Secondary | ICD-10-CM

## 2017-10-13 DIAGNOSIS — J209 Acute bronchitis, unspecified: Secondary | ICD-10-CM

## 2017-10-13 MED ORDER — TIOTROPIUM BROMIDE MONOHYDRATE 18 MCG IN CAPS: ORAL_CAPSULE | RESPIRATORY_TRACT | 3 refills | Status: DC

## 2017-10-13 MED ORDER — LEVOFLOXACIN 500 MG PO TABS: 500.0000 mg | ORAL_TABLET | Freq: Every day | ORAL | 0 refills | Status: DC

## 2017-10-13 MED ORDER — ALBUTEROL SULFATE HFA 108 (90 BASE) MCG/ACT IN AERS: 2.0000 | INHALATION_SPRAY | Freq: Four times a day (QID) | RESPIRATORY_TRACT | 0 refills | Status: AC | PRN

## 2017-10-13 NOTE — Progress Notes (Signed)
   Subjective:    Patient ID: Taylor Sawyer, female    DOB: [birth year], 83 y.o.   MRN: [id number]  HPI  Patient has been sick for over 2 weeks. She has been coughing and wheezing. The cough is nonproductive. She denies any fevers. She denies any chills. On examination today she has markedly diminished breath sounds bilaterally with expiratory wheezes bilaterally and bibasilar rhonchi.  She stopped spiriva.  She is not taking albuterol. Past Medical History:  Diagnosis Date  . Asthma   . COPD (chronic obstructive pulmonary disease) (HCC)   . Diabetes mellitus   . Hyperlipidemia   . Hypertension   . Hypothyroidism   . Tobacco user    Past Surgical History:  Procedure Laterality Date  . THYROIDECTOMY     Current Outpatient Prescriptions on File Prior to Visit  Medication Sig Dispense Refill  . Diphenhyd-Hydrocort-Nystatin (FIRST-DUKES MOUTHWASH) SUSP 5 ml po - swish and swallow q 6 hours (Use your formulation) 237 mL 0  . levothyroxine (SYNTHROID, LEVOTHROID) 75 MCG tablet TAKE ONE TABLET BY MOUTH ONCE DAILY 30 tablet 6  . losartan (COZAAR) 100 MG tablet Take 1 tablet (100 mg total) by mouth daily. 90 tablet 3  . metFORMIN (GLUCOPHAGE) 500 MG tablet Take 1 tablet (500 mg total) by mouth 2 (two) times daily with a meal. 180 tablet 1  . simvastatin (ZOCOR) 20 MG tablet TAKE 1 TABLET BY MOUTH AT BEDTIME 30 tablet 5   No current facility-administered medications on file prior to visit.    Allergies  Allergen Reactions  . Prednisone     Cramps.   Social History   Social History  . Marital status: Widowed    Spouse name: N/A  . Number of children: N/A  . Years of education: N/A   Occupational History  . Not on file.   Social History Main Topics  . Smoking status: Current Every Day Smoker  . Smokeless tobacco: Never Used  . Alcohol use Not on file  . Drug use: Unknown  . Sexual activity: Not on file   Other Topics Concern  . Not on file   Social History Narrative  . No  narrative on file     Review of Systems  All other systems reviewed and are negative.      Objective:   Physical Exam  HENT:  Right Ear: External ear normal.  Left Ear: External ear normal.  Nose: Nose normal.  Mouth/Throat: Oropharynx is clear and moist. No oropharyngeal exudate.  Eyes: Conjunctivae are normal.  Neck: Neck supple.  Cardiovascular: Normal rate, regular rhythm and normal heart sounds.   Pulmonary/Chest: Effort normal. She has decreased breath sounds. She has wheezes. She has rhonchi in the right lower field and the left lower field.  Lymphadenopathy:    She has no cervical adenopathy.  Vitals reviewed.         Assessment & Plan:  Acute bronchitis with COPD (HCC) - Plan: levofloxacin (LEVAQUIN) 500 MG tablet, albuterol (PROVENTIL HFA;VENTOLIN HFA) 108 (90 Base) MCG/ACT inhaler, tiotropium (SPIRIVA HANDIHALER) 18 MCG inhalation capsule  The patient is having a COPD exacerbation. Begin Levaquin 500 mg by mouth daily for 7 days. She refuses prednisone.  Start albuterol 2 puff inh q 6 hrs until improved.  Resume spiriva daily.  STOP SMOKING

## 2017-12-13 ENCOUNTER — Other Ambulatory Visit: Payer: Self-pay | Admitting: Family Medicine

## 2018-01-25 ENCOUNTER — Other Ambulatory Visit: Payer: Self-pay | Admitting: Family Medicine

## 2018-01-25 MED ORDER — SIMVASTATIN 20 MG PO TABS: 20.0000 mg | ORAL_TABLET | Freq: Every day | ORAL | 1 refills | Status: DC

## 2018-06-13 ENCOUNTER — Ambulatory Visit: Payer: Medicare Other | Admitting: Family Medicine

## 2018-06-13 ENCOUNTER — Encounter: Payer: Self-pay | Admitting: Family Medicine

## 2018-06-13 VITALS — BP 134/74 | HR 66 | Temp 97.8°F | Resp 14 | Wt 134.0 lb

## 2018-06-13 DIAGNOSIS — I10 Essential (primary) hypertension: Secondary | ICD-10-CM | POA: Diagnosis not present

## 2018-06-13 DIAGNOSIS — E039 Hypothyroidism, unspecified: Secondary | ICD-10-CM | POA: Diagnosis not present

## 2018-06-13 DIAGNOSIS — J449 Chronic obstructive pulmonary disease, unspecified: Secondary | ICD-10-CM | POA: Diagnosis not present

## 2018-06-13 DIAGNOSIS — E119 Type 2 diabetes mellitus without complications: Secondary | ICD-10-CM | POA: Diagnosis not present

## 2018-06-13 DIAGNOSIS — R413 Other amnesia: Secondary | ICD-10-CM

## 2018-06-13 LAB — VITAMIN B12: Vitamin B-12: 185 pg/mL — ABNORMAL LOW (ref 200–1100)

## 2018-06-13 MED ORDER — DONEPEZIL HCL 10 MG PO TABS
10.0000 mg | ORAL_TABLET | Freq: Every day | ORAL | 5 refills | Status: DC
Start: 2018-06-13 — End: 2018-07-27

## 2018-06-13 NOTE — Progress Notes (Signed)
Subjective:    Patient ID: Taylor Sawyer, female    DOB: Dec 24, 1930, 84 y.o.   MRN: [id number]  HPI  Patient has not been seen since October. PMH significant for tobacco abuse (ongoing), COPD, diabetes mellitus type 2 on metformin, hypothyroidism on levothyroxine, HLD, and ess HTN.  Daughter is concerned about memory loss.  Daughter states that over the last several months, patient has become increasingly forgetful.  She is demonstrating short-term memory deficits.  She is becoming more confused at night.  She is becoming more lethargic and more sedentary and refusing to leave the home.  Patient denies any depression.  She denies feeling sad.  I performed a Mini-Mental status exam on the patient today.  She told me the wrong date.  She is unable to tell me the correct day of the week, the correct month or the correct year.  She does get the correct season.  She scores 1 out of 5 on date.  She scores 5 out of 5 on location although she is unable to remember my name.  She is able to remember 3 objects on recall but she is unable to perform serial sevens or spell world in reverse.  She is able to draw intersecting pentagons and perform the rest of the Mini-Mental status exam.  She is unable to perform the clock drawing task.  She drawls numbers 12 through 5 becomes distracted and forgets what she was asked to do.  She does not even draw the hands on the clock.  She scores 20 out of 30 on Mini-Mental status exam.  Otherwise she is here for her regular follow-up.  She is overdue for a TSH to check her thyroid, overdue for hemoglobin A1c to check her diabetes.  Her blood pressure today is well controlled at 134/74.  She denies any chest pain shortness of breath or dyspnea on exertion Past Medical History:  Diagnosis Date  . Asthma   . COPD (chronic obstructive pulmonary disease) (HCC)   . Diabetes mellitus   . Hyperlipidemia   . Hypertension   . Hypothyroidism   . Tobacco user     Current Outpatient  Medications on File Prior to Visit  Medication Sig Dispense Refill  . albuterol (PROVENTIL HFA;VENTOLIN HFA) 108 (90 Base) MCG/ACT inhaler Inhale 2 puffs into the lungs every 6 (six) hours as needed for wheezing or shortness of breath. 1 Inhaler 0  . Diphenhyd-Hydrocort-Nystatin (FIRST-DUKES MOUTHWASH) SUSP 5 ml po - swish and swallow q 6 hours (Use your formulation) 237 mL 0  . levofloxacin (LEVAQUIN) 500 MG tablet Take 1 tablet (500 mg total) by mouth daily. 7 tablet 0  . levothyroxine (SYNTHROID, LEVOTHROID) 75 MCG tablet TAKE ONE TABLET BY MOUTH ONCE DAILY 30 tablet 6  . losartan (COZAAR) 100 MG tablet Take 1 tablet (100 mg total) by mouth daily. 90 tablet 3  . metFORMIN (GLUCOPHAGE) 500 MG tablet Take 1 tablet (500 mg total) by mouth 2 (two) times daily with a meal. 180 tablet 1  . simvastatin (ZOCOR) 20 MG tablet Take 1 tablet (20 mg total) by mouth at bedtime. 90 tablet 1  . tiotropium (SPIRIVA HANDIHALER) 18 MCG inhalation capsule USE 1 INHALATION ONCE DAILY AS INSTRUCTED 30 capsule 3   No current facility-administered medications on file prior to visit.    Allergies  Allergen Reactions  . Prednisone     Cramps.   Social History   Socioeconomic History  . Marital status: Widowed    Spouse name:  Not on file  . Number of children: Not on file  . Years of education: Not on file  . Highest education level: Not on file  Occupational History  . Not on file  Social Needs  . Financial resource strain: Not on file  . Food insecurity:    Worry: Not on file    Inability: Not on file  . Transportation needs:    Medical: Not on file    Non-medical: Not on file  Tobacco Use  . Smoking status: Current Every Day Smoker  . Smokeless tobacco: Never Used  Substance and Sexual Activity  . Alcohol use: Not on file  . Drug use: Not on file  . Sexual activity: Not on file  Lifestyle  . Physical activity:    Days per week: Not on file    Minutes per session: Not on file  . Stress: Not  on file  Relationships  . Social connections:    Talks on phone: Not on file    Gets together: Not on file    Attends religious service: Not on file    Active member of club or organization: Not on file    Attends meetings of clubs or organizations: Not on file    Relationship status: Not on file  . Intimate partner violence:    Fear of current or ex partner: Not on file    Emotionally abused: Not on file    Physically abused: Not on file    Forced sexual activity: Not on file  Other Topics Concern  . Not on file  Social History Narrative  . Not on file     Review of Systems  All other systems reviewed and are negative.      Objective:   Physical Exam  HENT:  Right Ear: External ear normal.  Left Ear: External ear normal.  Nose: Nose normal.  Mouth/Throat: Oropharynx is clear and moist. No oropharyngeal exudate.  Eyes: Conjunctivae are normal.  Neck: Neck supple.  Cardiovascular: Normal rate, regular rhythm and normal heart sounds.  Pulmonary/Chest: Effort normal. She has decreased breath sounds. She has wheezes. She has rhonchi in the right lower field and the left lower field.  Lymphadenopathy:    She has no cervical adenopathy.  Vitals reviewed.         Assessment & Plan:  Controlled type 2 diabetes mellitus without complication, without long-term current use of insulin (HCC) - Plan: CBC with Differential/Platelet, COMPLETE METABOLIC PANEL WITH GFR, Lipid panel, Hemoglobin A1c  Essential hypertension - Plan: CBC with Differential/Platelet, COMPLETE METABOLIC PANEL WITH GFR, Lipid panel  Hypothyroidism, unspecified type - Plan: CBC with Differential/Platelet, COMPLETE METABOLIC PANEL WITH GFR, TSH  Chronic obstructive pulmonary disease, unspecified COPD type (HCC)  Memory loss - Plan: Vitamin B12  Patient is demonstrating mild to moderate dementia.  Begin Aricept 10 mg a day and check TSH along with vitamin B12.  Regarding her hypothyroidism, check a TSH to  ensure adequate doses of her levothyroxine.  Her blood pressure is adequately controlled.  Regarding her diabetes, I will check a hemoglobin A1c.  Goal hemoglobin A1c would be less than 7.  I will also check a fasting lipid panel.  Goal fasting lipid panel is less than an LDL of 100.  Patient has no desire to quit smoking.  Spent more than 30 minutes with the patient and her family discussing strategies to prevent falls, wandering, and help ensure safety at home with a person with dementia.  Reassess the  patient in 6 months

## 2018-06-14 ENCOUNTER — Encounter: Payer: Self-pay | Admitting: Family Medicine

## 2018-06-14 DIAGNOSIS — E538 Deficiency of other specified B group vitamins: Secondary | ICD-10-CM | POA: Insufficient documentation

## 2018-06-14 DIAGNOSIS — R413 Other amnesia: Secondary | ICD-10-CM | POA: Insufficient documentation

## 2018-06-14 LAB — CBC WITH DIFFERENTIAL/PLATELET
BASOS ABS: 69 {cells}/uL (ref 0–200)
Basophils Relative: 1 %
EOS PCT: 4.4 %
Eosinophils Absolute: 304 cells/uL (ref 15–500)
HEMATOCRIT: 33 % — AB (ref 35.0–45.0)
Hemoglobin: 11.3 g/dL — ABNORMAL LOW (ref 11.7–15.5)
Lymphs Abs: 2608 cells/uL (ref 850–3900)
MCH: 32.4 pg (ref 27.0–33.0)
MCHC: 34.2 g/dL (ref 32.0–36.0)
MCV: 94.6 fL (ref 80.0–100.0)
MONOS PCT: 6.3 %
MPV: 9.9 fL (ref 7.5–12.5)
NEUTROS PCT: 50.5 %
Neutro Abs: 3485 cells/uL (ref 1500–7800)
Platelets: 237 10*3/uL (ref 140–400)
RBC: 3.49 10*6/uL — ABNORMAL LOW (ref 3.80–5.10)
RDW: 13 % (ref 11.0–15.0)
TOTAL LYMPHOCYTE: 37.8 %
WBC mixed population: 435 cells/uL (ref 200–950)
WBC: 6.9 10*3/uL (ref 3.8–10.8)

## 2018-06-14 LAB — COMPLETE METABOLIC PANEL WITH GFR
Albumin: 3.7 g/dL (ref 3.6–5.1)
BUN / CREAT RATIO: 14 (calc) (ref 6–22)
BUN: 20 mg/dL (ref 7–25)
CHLORIDE: 107 mmol/L (ref 98–110)
CO2: 20 mmol/L (ref 20–32)
Creat: 1.44 mg/dL — ABNORMAL HIGH (ref 0.60–0.88)
GFR, EST AFRICAN AMERICAN: 38 mL/min/{1.73_m2} — AB (ref >=60)
GFR, Est Non African American: 33 mL/min/{1.73_m2} — ABNORMAL LOW (ref >=60)
GLUCOSE: 98 mg/dL (ref 65–99)
Potassium: 4.5 mmol/L (ref 3.5–5.3)
SODIUM: 142 mmol/L (ref 135–146)

## 2018-06-14 LAB — HEMOGLOBIN A1C
Hgb A1c MFr Bld: 5.7 % of total Hgb — ABNORMAL HIGH (ref <5.7)
Mean Plasma Glucose: 117 (calc)
eAG (mmol/L): 6.5 (calc)

## 2018-06-14 LAB — TSH: TSH: 1.13 mIU/L (ref 0.40–4.50)

## 2018-06-14 LAB — LIPID PANEL

## 2018-06-19 ENCOUNTER — Other Ambulatory Visit: Payer: Self-pay | Admitting: Family Medicine

## 2018-06-20 ENCOUNTER — Ambulatory Visit (INDEPENDENT_AMBULATORY_CARE_PROVIDER_SITE_OTHER): Payer: Medicare Other

## 2018-06-20 DIAGNOSIS — E538 Deficiency of other specified B group vitamins: Secondary | ICD-10-CM

## 2018-06-20 MED ORDER — CYANOCOBALAMIN 1000 MCG/ML IJ SOLN
1000.0000 ug | Freq: Once | INTRAMUSCULAR | Status: AC
  Administered 2018-06-20: 1000 ug via SUBCUTANEOUS

## 2018-06-20 NOTE — Progress Notes (Signed)
Patient was in office today to start her b12 injections/ Patient received the injection in her right arm subcutaneous.Patient tolerated well. Patient was instructed to return in 1 week for her next b12 injection

## 2018-06-27 ENCOUNTER — Ambulatory Visit: Payer: Medicare Other

## 2018-06-28 ENCOUNTER — Ambulatory Visit (INDEPENDENT_AMBULATORY_CARE_PROVIDER_SITE_OTHER): Payer: Medicare Other | Admitting: Family Medicine

## 2018-06-28 DIAGNOSIS — E538 Deficiency of other specified B group vitamins: Secondary | ICD-10-CM | POA: Diagnosis not present

## 2018-06-28 MED ORDER — CYANOCOBALAMIN 1000 MCG/ML IJ SOLN: INTRAMUSCULAR | 2 refills | Status: DC

## 2018-06-28 MED ORDER — CYANOCOBALAMIN 1000 MCG/ML IJ SOLN
1000.0000 ug | Freq: Once | INTRAMUSCULAR | Status: AC
  Administered 2018-06-28: 1000 ug via INTRAMUSCULAR

## 2018-06-28 MED ORDER — "SYRINGE/NEEDLE (DISP) 25G X 1"" 3 ML MISC": 1 refills | Status: AC

## 2018-06-28 NOTE — Progress Notes (Signed)
Pt in office for B12 injection - 1ml given IM left deltoid and pt's daughter shown how to give her the injections at home as it is hard for her to get the pt in and out of house d/t mobility issues. Rx's sent to pharm. Pt is to receive 1 injection q week x 2 weeks and then go to monthly. Daughter verbalized understanding.

## 2018-06-30 ENCOUNTER — Other Ambulatory Visit: Payer: Self-pay | Admitting: Family Medicine

## 2018-07-06 ENCOUNTER — Ambulatory Visit (INDEPENDENT_AMBULATORY_CARE_PROVIDER_SITE_OTHER): Payer: Medicare Other | Admitting: Family Medicine

## 2018-07-06 ENCOUNTER — Ambulatory Visit
Admission: RE | Admit: 2018-07-06 | Discharge: 2018-07-06 | Disposition: A | Discharge status: Home / Self Care | Payer: Medicare Other | Source: Ambulatory Visit | Attending: Family Medicine | Admitting: Family Medicine

## 2018-07-06 ENCOUNTER — Encounter: Payer: Self-pay | Admitting: Family Medicine

## 2018-07-06 ENCOUNTER — Other Ambulatory Visit: Payer: Self-pay

## 2018-07-06 ENCOUNTER — Other Ambulatory Visit: Payer: Medicare Other

## 2018-07-06 VITALS — BP 124/80 | HR 65 | Temp 97.8°F | Resp 16 | Wt 134.4 lb

## 2018-07-06 DIAGNOSIS — S40011A Contusion of right shoulder, initial encounter: Secondary | ICD-10-CM | POA: Diagnosis not present

## 2018-07-06 DIAGNOSIS — S6991XA Unspecified injury of right wrist, hand and finger(s), initial encounter: Secondary | ICD-10-CM | POA: Diagnosis not present

## 2018-07-06 DIAGNOSIS — M25511 Pain in right shoulder: Secondary | ICD-10-CM | POA: Diagnosis not present

## 2018-07-06 DIAGNOSIS — S5001XA Contusion of right elbow, initial encounter: Secondary | ICD-10-CM

## 2018-07-06 DIAGNOSIS — S40012A Contusion of left shoulder, initial encounter: Secondary | ICD-10-CM

## 2018-07-06 DIAGNOSIS — W06XXXA Fall from bed, initial encounter: Secondary | ICD-10-CM

## 2018-07-06 DIAGNOSIS — M25512 Pain in left shoulder: Secondary | ICD-10-CM | POA: Diagnosis not present

## 2018-07-06 DIAGNOSIS — S4992XA Unspecified injury of left shoulder and upper arm, initial encounter: Secondary | ICD-10-CM | POA: Diagnosis not present

## 2018-07-06 DIAGNOSIS — S0990XA Unspecified injury of head, initial encounter: Secondary | ICD-10-CM | POA: Diagnosis not present

## 2018-07-06 DIAGNOSIS — S4991XA Unspecified injury of right shoulder and upper arm, initial encounter: Secondary | ICD-10-CM | POA: Diagnosis not present

## 2018-07-06 DIAGNOSIS — R51 Headache: Secondary | ICD-10-CM | POA: Diagnosis not present

## 2018-07-06 NOTE — Patient Instructions (Signed)
Follow up with PCP - Dr. Tanya NonesPickard ASAP to reevaluate for falls   Contusion - bruising and swelling, possible damage to skin and soft tissue without bony injury.  Once we get images we will call you with results, but treatment is resting, ice, elevating and compression (like an ace wrap or braces)   Fall Prevention in the Home Falls can cause injuries. They can happen to people of all ages. There are many things you can do to make your home safe and to help prevent falls. What can I do on the outside of my home?  Regularly fix the edges of walkways and driveways and fix any cracks.  Remove anything that might make you trip as you walk through a door, such as a raised step or threshold.  Trim any bushes or trees on the path to your home.  Use bright outdoor lighting.  Clear any walking paths of anything that might make someone trip, such as rocks or tools.  Regularly check to see if handrails are loose or broken. Make sure that both sides of any steps have handrails.  Any raised decks and porches should have guardrails on the edges.  Have any leaves, snow, or ice cleared regularly.  Use sand or salt on walking paths during winter.  Clean up any spills in your garage right away. This includes oil or grease spills. What can I do in the bathroom?  Use night lights.  Install grab bars by the toilet and in the tub and shower. Do not use towel bars as grab bars.  Use non-skid mats or decals in the tub or shower.  If you need to sit down in the shower, use a plastic, non-slip stool.  Keep the floor dry. Clean up any water that spills on the floor as soon as it happens.  Remove soap buildup in the tub or shower regularly.  Attach bath mats securely with double-sided non-slip rug tape.  Do not have throw rugs and other things on the floor that can make you trip. What can I do in the bedroom?  Use night lights.  Make sure that you have a light by your bed that is easy to  reach.  Do not use any sheets or blankets that are too big for your bed. They should not hang down onto the floor.  Have a firm chair that has side arms. You can use this for support while you get dressed.  Do not have throw rugs and other things on the floor that can make you trip. What can I do in the kitchen?  Clean up any spills right away.  Avoid walking on wet floors.  Keep items that you use a lot in easy-to-reach places.  If you need to reach something above you, use a strong step stool that has a grab bar.  Keep electrical cords out of the way.  Do not use floor polish or wax that makes floors slippery. If you must use wax, use non-skid floor wax.  Do not have throw rugs and other things on the floor that can make you trip. What can I do with my stairs?  Do not leave any items on the stairs.  Make sure that there are handrails on both sides of the stairs and use them. Fix handrails that are broken or loose. Make sure that handrails are as long as the stairways.  Check any carpeting to make sure that it is firmly attached to the stairs. Fix any carpet  that is loose or worn.  Avoid having throw rugs at the top or bottom of the stairs. If you do have throw rugs, attach them to the floor with carpet tape.  Make sure that you have a light switch at the top of the stairs and the bottom of the stairs. If you do not have them, ask someone to add them for you. What else can I do to help prevent falls?  Wear shoes that: ? Do not have high heels. ? Have rubber bottoms. ? Are comfortable and fit you well. ? Are closed at the toe. Do not wear sandals.  If you use a stepladder: ? Make sure that it is fully opened. Do not climb a closed stepladder. ? Make sure that both sides of the stepladder are locked into place. ? Ask someone to hold it for you, if possible.  Clearly mark and make sure that you can see: ? Any grab bars or handrails. ? First and last steps. ? Where the  edge of each step is.  Use tools that help you move around (mobility aids) if they are needed. These include: ? Canes. ? Walkers. ? Scooters. ? Crutches.  Turn on the lights when you go into a dark area. Replace any light bulbs as soon as they burn out.  Set up your furniture so you have a clear path. Avoid moving your furniture around.  If any of your floors are uneven, fix them.  If there are any pets around you, be aware of where they are.  Review your medicines with your doctor. Some medicines can make you feel dizzy. This can increase your chance of falling. Ask your doctor what other things that you can do to help prevent falls. This information is not intended to replace advice given to you by your health care provider. Make sure you discuss any questions you have with your health care provider. Document Released: 10/09/2009 Document Revised: 05/20/2016 Document Reviewed: 01/17/2015 Elsevier Interactive Patient Education  [employer].

## 2018-07-06 NOTE — Progress Notes (Signed)
Patient ID: Taylor Sawyer, female    DOB: 06-Aug-1930, 84 y.o.   MRN: [id number]  PCP: Donita Brooks, MD  Chief Complaint  Patient presents with  . Fall    Patient in today for multiple falls. Had a fall this morning and fell on 06/29/18. Has swelling to hand. Patient had fall out of bed.    Subjective:   Taylor Sawyer is a 84 y.o. female, presents to clinic with her children who she lives with, with CC of falling out of her bed 2 times in the last week.  First fall was unwitnessed, on Fourth of July, occurred at night, patient had no recollection of the fall, patient's family states that she had bruising on her left hand and left shoulder there would get her back into bed.  They believe that she slipped out of bed while getting into bed because of microfiber sheets in slick nightgown.  She otherwise has been at her baseline mental status and able to ambulate around with her walker.  This morning she fell out of bed, patient states that she was sitting on the edge of her bed and try to push herself back into bed when she fell out onto a carpeted surface, hitting her right side, she has bruising swelling and pain to her right shoulder elbow and hand.  There is a another unwitnessed fall, patient denies any loss of consciousness, she denies any neck pain, headache, chest pain, abdominal pain.  She does have some bruising and swelling to her right shoulder, hand and elbow, pain is mild to moderate.  She does not complain much, and she was able to take a shower and bathe this morning move her arms around.  She continues to be at her baseline mental status, follow commands, continues to keep same routine at home, minimal activity, transferring from her bed to her walker to a recliner chair.  Family members in room deny any seizure-like activity, syncope, confusion, nausea, vomiting.  Patient denies any headaches, visual disturbances.   Patient has been dealing with some dementia worked up and  treated over the last year.  PCP is Dr. Tanya Nones, he has discussed fall risk with family in the past, they deny any loose cords, poor lighting, loose carpets.  No falls reported in the last year or 2.  His first time seeking evaluation for these falls.  The members would like to inquire about obtaining hospital bed, different devices for ambulation, or in home health care assessments or visits.  [phone]    Patient Active Problem List   Diagnosis Date Noted  . Memory loss   . B12 deficiency   . COPD (chronic obstructive pulmonary disease) (HCC)   . Asthma   . Diabetes mellitus   . Hypertension   . Hypothyroidism      Prior to Admission medications   Medication Sig Start Date End Date Taking? Authorizing Provider  cyanocobalamin (,VITAMIN B-12,) 1000 MCG/ML injection Inject 1 ml weekly x 2 weeks then 1 ml monthly 06/28/18  Yes Pickard, Priscille Heidelberg, MD  donepezil (ARICEPT) 10 MG tablet Take 1 tablet (10 mg total) by mouth at bedtime. 06/13/18  Yes Donita Brooks, MD  levothyroxine (SYNTHROID, LEVOTHROID) 75 MCG tablet TAKE 1 TABLET BY MOUTH ONCE DAILY 06/20/18  Yes Donita Brooks, MD  losartan (COZAAR) 100 MG tablet Take 1 tablet (100 mg total) by mouth daily. 07/08/17  Yes Donita Brooks, MD  metFORMIN (GLUCOPHAGE) 500 MG  tablet Take 1 tablet (500 mg total) by mouth 2 (two) times daily with a meal. 04/06/17  Yes Donita Brooks, MD  metFORMIN (GLUCOPHAGE) 500 MG tablet TAKE ONE TABLET BY MOUTH TWICE DAILY WITH MEALS 06/30/18  Yes Donita Brooks, MD  simvastatin (ZOCOR) 20 MG tablet Take 1 tablet (20 mg total) by mouth at bedtime. 01/25/18  Yes Pickard, Priscille Heidelberg, MD  SYRINGE-NEEDLE, DISP, 3 ML (LUER LOCK SAFETY SYRINGES) 25G X 1" 3 ML MISC Use with B12 injections 06/28/18  Yes Pickard, Priscille Heidelberg, MD  albuterol (PROVENTIL HFA;VENTOLIN HFA) 108 (90 Base) MCG/ACT inhaler Inhale 2 puffs into the lungs every 6 (six) hours as needed for wheezing or shortness of breath. Patient  not taking: Reported on 06/13/2018 10/13/17   Donita Brooks, MD  tiotropium (SPIRIVA HANDIHALER) 18 MCG inhalation capsule USE 1 INHALATION ONCE DAILY AS INSTRUCTED Patient not taking: Reported on 06/13/2018 10/13/17   Donita Brooks, MD     Allergies  Allergen Reactions  . Prednisone     Cramps.     No family history on file.   Social History   Socioeconomic History  . Marital status: Widowed    Spouse name: Not on file  . Number of children: Not on file  . Years of education: Not on file  . Highest education level: Not on file  Occupational History  . Not on file  Social Needs  . Financial resource strain: Not on file  . Food insecurity:    Worry: Not on file    Inability: Not on file  . Transportation needs:    Medical: Not on file    Non-medical: Not on file  Tobacco Use  . Smoking status: Current Every Day Smoker  . Smokeless tobacco: Never Used  Substance and Sexual Activity  . Alcohol use: Not on file  . Drug use: Not on file  . Sexual activity: Not on file  Lifestyle  . Physical activity:    Days per week: Not on file    Minutes per session: Not on file  . Stress: Not on file  Relationships  . Social connections:    Talks on phone: Not on file    Gets together: Not on file    Attends religious service: Not on file    Active member of club or organization: Not on file    Attends meetings of clubs or organizations: Not on file    Relationship status: Not on file  . Intimate partner violence:    Fear of current or ex partner: Not on file    Emotionally abused: Not on file    Physically abused: Not on file    Forced sexual activity: Not on file  Other Topics Concern  . Not on file  Social History Narrative  . Not on file     Review of Systems  Constitutional: Negative.  Negative for activity change, appetite change, chills, diaphoresis, fatigue, fever and unexpected weight change.  HENT: Negative.   Respiratory: Negative.     Cardiovascular: Negative.  Negative for chest pain.  Gastrointestinal: Negative.  Negative for abdominal pain.  Genitourinary: Negative.   Musculoskeletal: Positive for arthralgias and joint swelling. Negative for back pain, gait problem, neck pain and neck stiffness.  Skin: Positive for color change. Negative for pallor, rash and wound.  Neurological: Negative for dizziness, tremors, seizures, syncope, facial asymmetry, speech difficulty, weakness, light-headedness, numbness and headaches.  Psychiatric/Behavioral: Negative for agitation, behavioral problems, decreased concentration, dysphoric mood,  self-injury and sleep disturbance.  All other systems reviewed and are negative.      Objective:    There were no vitals filed for this visit.    Physical Exam  Constitutional: She is oriented to person, place, and time. She appears well-developed and well-nourished.  Non-toxic appearance. No distress.  Elderly female, appears stated age, NAD  HENT:  Head: Normocephalic and atraumatic.  Right Ear: External ear normal.  Left Ear: External ear normal.  Nose: Nose normal.  Mouth/Throat: Uvula is midline, oropharynx is clear and moist and mucous membranes are normal.  Eyes: Pupils are equal, round, and reactive to light. Conjunctivae, EOM and lids are normal. No scleral icterus.  Neck: Normal range of motion and phonation normal. Neck supple. No tracheal deviation present.  Cardiovascular: Normal rate, regular rhythm, normal heart sounds and normal pulses. Exam reveals no gallop and no friction rub.  No murmur heard. Pulses:      Radial pulses are 2+ on the right side, and 2+ on the left side.       Posterior tibial pulses are 2+ on the right side, and 2+ on the left side.  Pulmonary/Chest: Effort normal and breath sounds normal. No stridor. No respiratory distress. She has no wheezes. She has no rhonchi. She has no rales. She exhibits no tenderness.  Abdominal: Soft. Normal appearance and  bowel sounds are normal. She exhibits no distension. There is no tenderness. There is no rebound and no guarding.  Musculoskeletal: Normal range of motion. She exhibits edema and tenderness.  Right shoulder with mild tenderness to palpation to glenoid fossa, ecchymosis to same area, very mild edema, no erythema, no deformity, normal range of motion with crepitus palpated Right wrist, hand and second and third fingers with soft edema and scant bruising, normal range of motion of fingers hand and wrist, good grip strength, normal sensation and capillary refill and radial pulse.  Swelling is more concentrated over snuffbox  No midline tenderness or step-off from cervical to lumbar spine, no paraspinal muscle tenderness from cervical lumbar spine, grossly normal range of motion of back with ability to get up and down from chair to walking with walker.  Normal range of motion of neck   Lymphadenopathy:    She has no cervical adenopathy.  Neurological: She is alert and oriented to person, place, and time. No cranial nerve deficit or sensory deficit. She exhibits normal muscle tone. Coordination and gait normal.  Slightly antalgic gait, with a walker, ataxia, no dysmetria, cranial nerves II through XII grossly intact, patient alert and oriented x3, follows instructions appropriately and answers questions appropriately with short answers  Skin: Skin is warm, dry and intact. Capillary refill takes less than 2 seconds. No rash noted. She is not diaphoretic. No pallor.  Psychiatric: She has a normal mood and affect. Her speech is normal and behavior is normal.  Nursing note and vitals reviewed.         Assessment & Plan:      ICD-10-CM   1. Contusion of right shoulder, initial encounter S40.011A DG Shoulder Right  2. Contusion of right elbow, initial encounter S50.01XA   3. Injury of right hand including fingers, initial encounter S69.91XA DG Hand Complete Right  4. Fall from bed, initial encounter  W06.XXXA DG Shoulder Right    DG Shoulder Left    DG Hand Complete Right    CT Head Wo Contrast  5. Contusion of left shoulder, initial encounter S40.012A DG Shoulder Left  6. Closed  head injury, initial encounter S09.90XA CT Head Wo Contrast     Unwitnessed fall x2 of elderly female who lives with her daughter and son-in-law, both falls have happened with supposedly slipping out of bed onto carpeted surface, has ecchymosis and edema to right shoulder right elbow right hand and wrist and fingers.  Old bruising to left shoulder with previous fall from 1 week ago.  Patient has had no change her baseline mental status, history of dementia, no acute changes, no syncope, no seizure-like activity, some tenderness to palpation of shoulders bilaterally and hand wrist and fingers, no neck pain or decreased range of motion on exam, no visible head injury, no depressed skull fracture, no scalp contusions, focal neurological exam has a baseline antalgic gait using walker.  Discussed with family members in room and consulted Dr. Tanya Nones regarding imaging, will obtain CT head without contrast to rule out any cranial bleed, bilateral shoulder films because of tenderness to palpation, bruising and swelling, right hand x-rays.  Was unable to schedule these images today, will obtain tomorrow morning, patient was encouraged to follow-up with PCP for future management of home visits or DME/hospital bed etc.  Danelle Berry, PA-C 07/06/18 2:33 PM

## 2018-07-07 ENCOUNTER — Other Ambulatory Visit: Payer: Self-pay | Admitting: Family Medicine

## 2018-07-07 ENCOUNTER — Other Ambulatory Visit: Payer: Self-pay

## 2018-07-07 ENCOUNTER — Ambulatory Visit
Admission: RE | Admit: 2018-07-07 | Discharge: 2018-07-07 | Disposition: A | Discharge status: Home / Self Care | Payer: Medicare Other | Source: Ambulatory Visit | Attending: Family Medicine | Admitting: Family Medicine

## 2018-07-07 DIAGNOSIS — W06XXXA Fall from bed, initial encounter: Secondary | ICD-10-CM

## 2018-07-07 DIAGNOSIS — S0990XA Unspecified injury of head, initial encounter: Secondary | ICD-10-CM

## 2018-07-07 NOTE — Progress Notes (Signed)
Called and spoke to Taylor Sawyer about all lab results CT negative for bleed. Will have future UA order available if any trouble with her coordination or more falls, would be worth checking to r/o UTI. Taylor Sawyer states he lowered her bed 5 inches and they have been watching her carefully and she has had no problems, the bed is much easier for her to get in and out of, she can plant both feet on the ground for transitioning.  He is also installing a rail.

## 2018-07-10 ENCOUNTER — Other Ambulatory Visit: Payer: Self-pay | Admitting: Family Medicine

## 2018-07-10 MED ORDER — FLUTICASONE PROPIONATE 50 MCG/ACT NA SUSP: 2.0000 | Freq: Every day | NASAL | 6 refills | Status: AC

## 2018-07-11 ENCOUNTER — Ambulatory Visit: Payer: Medicare Other | Admitting: Family Medicine

## 2018-07-18 ENCOUNTER — Telehealth: Payer: Self-pay | Admitting: Family Medicine

## 2018-07-18 NOTE — Telephone Encounter (Signed)
Stay off Aricept.  This is definitely the cause of her GI symptoms.  Once she is stable and better, we could try Namenda 5 mg twice daily

## 2018-07-18 NOTE — Telephone Encounter (Signed)
Pam called and since pt has started taking Aricept she has developed diarrhea and not eating hardly anything. She did stop the medication last week and symptoms improved greatly. Is there something else you would recommend?

## 2018-07-19 NOTE — Telephone Encounter (Signed)
LMTRC

## 2018-07-21 NOTE — Telephone Encounter (Signed)
Patient aware of providers recommendations via vm and has an apt this Tuesday can discuss further at that time.

## 2018-07-25 ENCOUNTER — Ambulatory Visit: Payer: Medicare Other | Admitting: Family Medicine

## 2018-07-26 ENCOUNTER — Encounter: Payer: Self-pay | Admitting: Family Medicine

## 2018-07-27 ENCOUNTER — Other Ambulatory Visit: Payer: Self-pay | Admitting: Family Medicine

## 2018-07-27 ENCOUNTER — Encounter: Payer: Self-pay | Admitting: Family Medicine

## 2018-07-27 ENCOUNTER — Ambulatory Visit (INDEPENDENT_AMBULATORY_CARE_PROVIDER_SITE_OTHER): Payer: Medicare Other | Admitting: Family Medicine

## 2018-07-27 VITALS — BP 128/84 | HR 68 | Temp 98.2°F | Resp 18 | Wt 135.0 lb

## 2018-07-27 DIAGNOSIS — E538 Deficiency of other specified B group vitamins: Secondary | ICD-10-CM

## 2018-07-27 DIAGNOSIS — R413 Other amnesia: Secondary | ICD-10-CM | POA: Diagnosis not present

## 2018-07-27 MED ORDER — MEMANTINE HCL 5 MG PO TABS: 5.0000 mg | ORAL_TABLET | Freq: Two times a day (BID) | ORAL | 2 refills | Status: DC

## 2018-07-27 NOTE — Progress Notes (Signed)
Subjective:    Patient ID: Taylor BalesSusie C Sawyer, female    DOB: 09-27-1930, 84 y.o.   MRN: [id number]  HPI 06/13/18 Patient has not been seen since October. PMH significant for tobacco abuse (ongoing), COPD, diabetes mellitus type 2 on metformin, hypothyroidism on levothyroxine, HLD, and ess HTN.  Daughter is concerned about memory loss.  Daughter states that over the last several months, patient has become increasingly forgetful.  She is demonstrating short-term memory deficits.  She is becoming more confused at night.  She is becoming more lethargic and more sedentary and refusing to leave the home.  Patient denies any depression.  She denies feeling sad.  I performed a Mini-Mental status exam on the patient today.  She told me the wrong date.  She is unable to tell me the correct day of the week, the correct month or the correct year.  She does get the correct season.  She scores 1 out of 5 on date.  She scores 5 out of 5 on location although she is unable to remember my name.  She is able to remember 3 objects on recall but she is unable to perform serial sevens or spell world in reverse.  She is able to draw intersecting pentagons and perform the rest of the Mini-Mental status exam.  She is unable to perform the clock drawing task.  She drawls numbers 12 through 5 becomes distracted and forgets what she was asked to do.  She does not even draw the hands on the clock.  She scores 20 out of 30 on Mini-Mental status exam.  Otherwise she is here for her regular follow-up.  She is overdue for a TSH to check her thyroid, overdue for hemoglobin A1c to check her diabetes.  Her blood pressure today is well controlled at 134/74.  She denies any chest pain shortness of breath or dyspnea on exertion.  At that time, my plan was: Patient is demonstrating mild to moderate dementia.  Begin Aricept 10 mg a day and check TSH along with vitamin B12.  Regarding her hypothyroidism, check a TSH to ensure adequate doses of her  levothyroxine.  Her blood pressure is adequately controlled.  Regarding her diabetes, I will check a hemoglobin A1c.  Goal hemoglobin A1c would be less than 7.  I will also check a fasting lipid panel.  Goal fasting lipid panel is less than an LDL of 100.  Patient has no desire to quit smoking.  Spent more than 30 minutes with the patient and her family discussing strategies to prevent falls, wandering, and help ensure safety at home with a person with dementia.  Reassess the patient in 6 months.  07/27/18 After last visit, patient's B12 values were found to be low and parenteral B12 was recommended in addition to Aricept for dementia.  She is also experienced several falls since I last saw her.  She saw my partner.  X-rays of both shoulders were negative for any fracture.  X-ray of the right hand was negative for fracture.  CT scan of the brain revealed no intracranial hemorrhage or normal pressure hydrocephalus.  Patient was unable to tolerate Aricept.  Aricept gave her terrible diarrhea.  Furthermore, the patient's daughter states that she became very withdrawn and noncommunicative on the Aricept.  Therefore they discontinued the medication.  Off the medication, the diarrhea has subsided and she is interacting more and seems to be closer to her baseline.  She still demonstrates significant short-term memory problems.  For instance,  her daughter asked her to visited her yesterday and the patient was unable to recollect that her daughter-in-law had come to see her or that her son-in-law come to see her. Past Medical History:  Diagnosis Date  . Asthma   . B12 deficiency   . COPD (chronic obstructive pulmonary disease) (HCC)   . Diabetes mellitus   . Hyperlipidemia   . Hypertension   . Hypothyroidism   . Memory loss   . Tobacco user     Current Outpatient Medications on File Prior to Visit  Medication Sig Dispense Refill  . cyanocobalamin (,VITAMIN B-12,) 1000 MCG/ML injection Inject 1 ml weekly x 2  weeks then 1 ml monthly 10 mL 2  . donepezil (ARICEPT) 10 MG tablet Take 1 tablet (10 mg total) by mouth at bedtime. 30 tablet 5  . fluticasone (FLONASE) 50 MCG/ACT nasal spray Place 2 sprays into both nostrils daily. 16 g 6  . levothyroxine (SYNTHROID, LEVOTHROID) 75 MCG tablet TAKE 1 TABLET BY MOUTH ONCE DAILY 90 tablet 3  . metFORMIN (GLUCOPHAGE) 500 MG tablet TAKE ONE TABLET BY MOUTH TWICE DAILY WITH MEALS 180 tablet 1  . simvastatin (ZOCOR) 20 MG tablet Take 1 tablet (20 mg total) by mouth at bedtime. 90 tablet 1  . SYRINGE-NEEDLE, DISP, 3 ML (LUER LOCK SAFETY SYRINGES) 25G X 1" 3 ML MISC Use with B12 injections 50 each 1  . tiotropium (SPIRIVA HANDIHALER) 18 MCG inhalation capsule USE 1 INHALATION ONCE DAILY AS INSTRUCTED 30 capsule 3  . albuterol (PROVENTIL HFA;VENTOLIN HFA) 108 (90 Base) MCG/ACT inhaler Inhale 2 puffs into the lungs every 6 (six) hours as needed for wheezing or shortness of breath. (Patient not taking: Reported on 06/13/2018) 1 Inhaler 0   No current facility-administered medications on file prior to visit.    Allergies  Allergen Reactions  . Prednisone     Cramps.   Social History   Socioeconomic History  . Marital status: Widowed    Spouse name: Not on file  . Number of children: Not on file  . Years of education: Not on file  . Highest education level: Not on file  Occupational History  . Not on file  Social Needs  . Financial resource strain: Not on file  . Food insecurity:    Worry: Not on file    Inability: Not on file  . Transportation needs:    Medical: Not on file    Non-medical: Not on file  Tobacco Use  . Smoking status: Current Every Day Smoker  . Smokeless tobacco: Never Used  Substance and Sexual Activity  . Alcohol use: Not on file  . Drug use: Not on file  . Sexual activity: Not on file  Lifestyle  . Physical activity:    Days per week: Not on file    Minutes per session: Not on file  . Stress: Not on file  Relationships  .  Social connections:    Talks on phone: Not on file    Gets together: Not on file    Attends religious service: Not on file    Active member of club or organization: Not on file    Attends meetings of clubs or organizations: Not on file    Relationship status: Not on file  . Intimate partner violence:    Fear of current or ex partner: Not on file    Emotionally abused: Not on file    Physically abused: Not on file    Forced sexual activity: Not  on file  Other Topics Concern  . Not on file  Social History Narrative  . Not on file     Review of Systems  All other systems reviewed and are negative.      Objective:   Physical Exam  HENT:  Right Ear: External ear normal.  Left Ear: External ear normal.  Nose: Nose normal.  Mouth/Throat: Oropharynx is clear and moist. No oropharyngeal exudate.  Eyes: Conjunctivae are normal.  Neck: Neck supple.  Cardiovascular: Normal rate, regular rhythm and normal heart sounds.  Pulmonary/Chest: Effort normal and breath sounds normal. She has no wheezes. She has no rhonchi.  Lymphadenopathy:    She has no cervical adenopathy.  Vitals reviewed.         Assessment & Plan:  I believe the patient has Alzheimer's type dementia coupled with B12 deficiency.  She is currently on monthly B12 parenteral injections that her daughter is giving her.  However given her recent falls, her persistent short-term memory loss, I still believe that we need to initiate medication to delay progression.  Therefore I have recommended Namenda 5 mg p.o. twice daily and reassess in 1 month.  Increase as tolerated to 10 mg p.o. twice daily

## 2018-12-03 ENCOUNTER — Other Ambulatory Visit: Payer: Self-pay | Admitting: Family Medicine

## 2018-12-14 ENCOUNTER — Encounter: Payer: Self-pay | Admitting: Family Medicine

## 2018-12-14 ENCOUNTER — Ambulatory Visit
Admission: RE | Admit: 2018-12-14 | Discharge: 2018-12-14 | Disposition: A | Discharge status: Home / Self Care | Payer: Medicare Other | Source: Ambulatory Visit | Attending: Family Medicine | Admitting: Family Medicine

## 2018-12-14 ENCOUNTER — Ambulatory Visit (INDEPENDENT_AMBULATORY_CARE_PROVIDER_SITE_OTHER): Payer: Medicare Other | Admitting: Family Medicine

## 2018-12-14 VITALS — BP 138/74 | HR 66 | Temp 97.5°F | Resp 14 | Wt 135.0 lb

## 2018-12-14 DIAGNOSIS — M25571 Pain in right ankle and joints of right foot: Secondary | ICD-10-CM

## 2018-12-14 DIAGNOSIS — J449 Chronic obstructive pulmonary disease, unspecified: Secondary | ICD-10-CM

## 2018-12-14 DIAGNOSIS — R413 Other amnesia: Secondary | ICD-10-CM | POA: Diagnosis not present

## 2018-12-14 DIAGNOSIS — E039 Hypothyroidism, unspecified: Secondary | ICD-10-CM | POA: Diagnosis not present

## 2018-12-14 DIAGNOSIS — E119 Type 2 diabetes mellitus without complications: Secondary | ICD-10-CM

## 2018-12-14 DIAGNOSIS — I10 Essential (primary) hypertension: Secondary | ICD-10-CM | POA: Diagnosis not present

## 2018-12-14 DIAGNOSIS — M7989 Other specified soft tissue disorders: Secondary | ICD-10-CM | POA: Diagnosis not present

## 2018-12-14 DIAGNOSIS — E538 Deficiency of other specified B group vitamins: Secondary | ICD-10-CM

## 2018-12-14 MED ORDER — MEMANTINE HCL 10 MG PO TABS: 10.0000 mg | ORAL_TABLET | Freq: Two times a day (BID) | ORAL | 1 refills | Status: DC

## 2018-12-14 MED ORDER — CEPHALEXIN 500 MG PO CAPS: 500.0000 mg | ORAL_CAPSULE | Freq: Three times a day (TID) | ORAL | 0 refills | Status: DC

## 2018-12-14 NOTE — Progress Notes (Signed)
Subjective:    Patient ID: Taylor Sawyer, female    DOB: 09-27-1930, 84 y.o.   MRN: [id number]  HPI 06/13/18 Patient has not been seen since October. PMH significant for tobacco abuse (ongoing), COPD, diabetes mellitus type 2 on metformin, hypothyroidism on levothyroxine, HLD, and ess HTN.  Daughter is concerned about memory loss.  Daughter states that over the last several months, patient has become increasingly forgetful.  She is demonstrating short-term memory deficits.  She is becoming more confused at night.  She is becoming more lethargic and more sedentary and refusing to leave the home.  Patient denies any depression.  She denies feeling sad.  I performed a Mini-Mental status exam on the patient today.  She told me the wrong date.  She is unable to tell me the correct day of the week, the correct month or the correct year.  She does get the correct season.  She scores 1 out of 5 on date.  She scores 5 out of 5 on location although she is unable to remember my name.  She is able to remember 3 objects on recall but she is unable to perform serial sevens or spell world in reverse.  She is able to draw intersecting pentagons and perform the rest of the Mini-Mental status exam.  She is unable to perform the clock drawing task.  She drawls numbers 12 through 5 becomes distracted and forgets what she was asked to do.  She does not even draw the hands on the clock.  She scores 20 out of 30 on Mini-Mental status exam.  Otherwise she is here for her regular follow-up.  She is overdue for a TSH to check her thyroid, overdue for hemoglobin A1c to check her diabetes.  Her blood pressure today is well controlled at 134/74.  She denies any chest pain shortness of breath or dyspnea on exertion.  At that time, my plan was: Patient is demonstrating mild to moderate dementia.  Begin Aricept 10 mg a day and check TSH along with vitamin B12.  Regarding her hypothyroidism, check a TSH to ensure adequate doses of her  levothyroxine.  Her blood pressure is adequately controlled.  Regarding her diabetes, I will check a hemoglobin A1c.  Goal hemoglobin A1c would be less than 7.  I will also check a fasting lipid panel.  Goal fasting lipid panel is less than an LDL of 100.  Patient has no desire to quit smoking.  Spent more than 30 minutes with the patient and her family discussing strategies to prevent falls, wandering, and help ensure safety at home with a person with dementia.  Reassess the patient in 6 months.  07/27/18 After last visit, patient's B12 values were found to be low and parenteral B12 was recommended in addition to Aricept for dementia.  She is also experienced several falls since I last saw her.  She saw my partner.  X-rays of both shoulders were negative for any fracture.  X-ray of the right hand was negative for fracture.  CT scan of the brain revealed no intracranial hemorrhage or normal pressure hydrocephalus.  Patient was unable to tolerate Aricept.  Aricept gave her terrible diarrhea.  Furthermore, the patient's daughter states that she became very withdrawn and noncommunicative on the Aricept.  Therefore they discontinued the medication.  Off the medication, the diarrhea has subsided and she is interacting more and seems to be closer to her baseline.  She still demonstrates significant short-term memory problems.  For instance,  her daughter asked her to visited her yesterday and the patient was unable to recollect that her daughter-in-law had come to see her or that her son-in-law come to see her.  At that time, my plan was: I believe the patient has Alzheimer's type dementia coupled with B12 deficiency.  She is currently on monthly B12 parenteral injections that her daughter is giving her.  However given her recent falls, her persistent short-term memory loss, I still believe that we need to initiate medication to delay progression.  Therefore I have recommended Namenda 5 mg p.o. twice daily and reassess  in 1 month.  Increase as tolerated to 10 mg p.o. twice daily  12/14/18 Here for follow up.  Patient's daughter states that she feels her memory stable perhaps even slightly better.  Recently a family friend has moved in with him and is conversing with the patient on a daily basis.  As a result, the patient has become more more conversant and is acting more like her normal self.  Although there is still baseline memory deficit that is apparent.  Approximately 2 to 3 weeks ago, the patient struck the lateral aspect of her right ankle directly over the lateral malleolus on an object.  That area now is red hot and swollen.  She has some mild tenderness to palpation in that area.  There is no palpable defect in the bone.  She has normal flexion and extension of the ankle without pain.  Inversion of the ankle elicits pain.  Eversion of the ankle does not elicit any pain.  I suspect a mild case of cellulitis given the redness the warmth and the pain.  She is also here today for follow-up of her underlying medical problems.  She has a history of hyperlipidemia for which she takes simvastatin 20 mg a day, she has hypothyroidism for which she takes levothyroxine 75 mcg daily, she has COPD/emphysema for which she takes Spiriva 1 inhalation a day, she has hypertension for which she takes losartan 100 mg a day, and she has diabetes for which she takes metformin.  Patient denies any chest pain shortness of breath or dyspnea on exertion.  They deny any hypoglycemic episodes.  They deny any hypoglycemia.  Past Medical History:  Diagnosis Date  . Asthma   . B12 deficiency   . COPD (chronic obstructive pulmonary disease) (HCC)   . Diabetes mellitus   . Hyperlipidemia   . Hypertension   . Hypothyroidism   . Memory loss   . Tobacco user     Current Outpatient Medications on File Prior to Visit  Medication Sig Dispense Refill  . cyanocobalamin (,VITAMIN B-12,) 1000 MCG/ML injection Inject 1 ml weekly x 2 weeks then 1  ml monthly 10 mL 2  . fluticasone (FLONASE) 50 MCG/ACT nasal spray Place 2 sprays into both nostrils daily. 16 g 6  . levothyroxine (SYNTHROID, LEVOTHROID) 75 MCG tablet TAKE 1 TABLET BY MOUTH ONCE DAILY 90 tablet 3  . losartan (COZAAR) 100 MG tablet TAKE 1 TABLET BY MOUTH ONCE DAILY 90 tablet 3  . memantine (NAMENDA) 5 MG tablet TAKE 1 TABLET BY MOUTH TWICE DAILY 60 tablet 2  . metFORMIN (GLUCOPHAGE) 500 MG tablet TAKE ONE TABLET BY MOUTH TWICE DAILY WITH MEALS 180 tablet 1  . simvastatin (ZOCOR) 20 MG tablet Take 1 tablet (20 mg total) by mouth at bedtime. 90 tablet 1  . SYRINGE-NEEDLE, DISP, 3 ML (LUER LOCK SAFETY SYRINGES) 25G X 1" 3 ML MISC Use with B12  injections 50 each 1  . tiotropium (SPIRIVA HANDIHALER) 18 MCG inhalation capsule USE 1 INHALATION ONCE DAILY AS INSTRUCTED 30 capsule 3  . albuterol (PROVENTIL HFA;VENTOLIN HFA) 108 (90 Base) MCG/ACT inhaler Inhale 2 puffs into the lungs every 6 (six) hours as needed for wheezing or shortness of breath. (Patient not taking: Reported on 06/13/2018) 1 Inhaler 0   No current facility-administered medications on file prior to visit.    Allergies  Allergen Reactions  . Prednisone     Cramps.   Social History   Socioeconomic History  . Marital status: Widowed    Spouse name: Not on file  . Number of children: Not on file  . Years of education: Not on file  . Highest education level: Not on file  Occupational History  . Not on file  Social Needs  . Financial resource strain: Not on file  . Food insecurity:    Worry: Not on file    Inability: Not on file  . Transportation needs:    Medical: Not on file    Non-medical: Not on file  Tobacco Use  . Smoking status: Current Every Day Smoker  . Smokeless tobacco: Never Used  Substance and Sexual Activity  . Alcohol use: Not on file  . Drug use: Not on file  . Sexual activity: Not on file  Lifestyle  . Physical activity:    Days per week: Not on file    Minutes per session: Not on  file  . Stress: Not on file  Relationships  . Social connections:    Talks on phone: Not on file    Gets together: Not on file    Attends religious service: Not on file    Active member of club or organization: Not on file    Attends meetings of clubs or organizations: Not on file    Relationship status: Not on file  . Intimate partner violence:    Fear of current or ex partner: Not on file    Emotionally abused: Not on file    Physically abused: Not on file    Forced sexual activity: Not on file  Other Topics Concern  . Not on file  Social History Narrative  . Not on file     Review of Systems  All other systems reviewed and are negative.      Objective:   Physical Exam  HENT:  Right Ear: External ear normal.  Left Ear: External ear normal.  Nose: Nose normal.  Mouth/Throat: Oropharynx is clear and moist. No oropharyngeal exudate.  Eyes: Conjunctivae are normal.  Neck: Neck supple.  Cardiovascular: Normal rate, regular rhythm and normal heart sounds.  Pulmonary/Chest: Effort normal and breath sounds normal. She has no wheezes. She has no rhonchi. She has no rales.  Abdominal: Soft. Bowel sounds are normal. She exhibits no distension. There is no abdominal tenderness. There is no rebound and no guarding.  Musculoskeletal:     Right ankle: She exhibits swelling. Tenderness. Lateral malleolus tenderness found. No medial malleolus and no proximal fibula tenderness found.       Feet:  Lymphadenopathy:    She has no cervical adenopathy.  Vitals reviewed.   Redness, heat, and tenderness over the right lateral malleolus      Assessment & Plan:  Controlled type 2 diabetes mellitus without complication, without long-term current use of insulin (HCC) - Plan: CBC with Differential/Platelet, COMPLETE METABOLIC PANEL WITH GFR, Lipid panel, Hemoglobin A1c  Essential hypertension  Hypothyroidism, unspecified type -  Plan: TSH  Memory loss  B12 deficiency  Chronic  obstructive pulmonary disease, unspecified COPD type (HCC)  Acute right ankle pain - Plan: DG Ankle Complete Right  I am concerned the patient has developed a secondary cellulitis over her right lateral malleolus.  Begin Keflex 500 mg p.o. 3 times daily for 7 days.  Proceed with an x-ray of the ankle given her underlying history of diabetes and peripheral vascular disease to rule out possible underlying osteomyelitis.  Patient's dementia is stable.  I will increase her Namenda to 10 mg twice daily.  Regarding her diabetes and hypothyroidism, I will check a CBC, CMP, hemoglobin A1c, TSH, and panel.  Await the results of her lab work.

## 2018-12-15 LAB — COMPLETE METABOLIC PANEL WITH GFR
AG Ratio: 1.4 (calc) (ref 1.0–2.5)
ALT: 5 U/L — ABNORMAL LOW (ref 6–29)
AST: 14 U/L (ref 10–35)
Albumin: 4 g/dL (ref 3.6–5.1)
Alkaline phosphatase (APISO): 82 U/L (ref 33–130)
BUN/Creatinine Ratio: 14 (calc) (ref 6–22)
BUN: 19 mg/dL (ref 7–25)
CO2: 23 mmol/L (ref 20–32)
Calcium: 8.6 mg/dL (ref 8.6–10.4)
Chloride: 105 mmol/L (ref 98–110)
Creat: 1.37 mg/dL — ABNORMAL HIGH (ref 0.60–0.88)
GFR, Est African American: 40 mL/min/{1.73_m2} — ABNORMAL LOW (ref >=60)
GFR, Est Non African American: 35 mL/min/{1.73_m2} — ABNORMAL LOW (ref >=60)
Globulin: 2.9 g/dL (calc) (ref 1.9–3.7)
Glucose, Bld: 82 mg/dL (ref 65–99)
Potassium: 3.5 mmol/L (ref 3.5–5.3)
Sodium: 144 mmol/L (ref 135–146)
Total Bilirubin: 0.3 mg/dL (ref 0.2–1.2)
Total Protein: 6.9 g/dL (ref 6.1–8.1)

## 2018-12-15 LAB — CBC WITH DIFFERENTIAL/PLATELET
Absolute Monocytes: 448 cells/uL (ref 200–950)
Basophils Absolute: 61 cells/uL (ref 0–200)
Basophils Relative: 0.8 %
EOS ABS: 243 {cells}/uL (ref 15–500)
Eosinophils Relative: 3.2 %
HCT: 35.3 % (ref 35.0–45.0)
Hemoglobin: 11.4 g/dL — ABNORMAL LOW (ref 11.7–15.5)
Lymphs Abs: 2576 cells/uL (ref 850–3900)
MCH: 30.7 pg (ref 27.0–33.0)
MCHC: 32.3 g/dL (ref 32.0–36.0)
MCV: 95.1 fL (ref 80.0–100.0)
MONOS PCT: 5.9 %
MPV: 10.4 fL (ref 7.5–12.5)
Neutro Abs: 4271 cells/uL (ref 1500–7800)
Neutrophils Relative %: 56.2 %
Platelets: 252 10*3/uL (ref 140–400)
RBC: 3.71 10*6/uL — ABNORMAL LOW (ref 3.80–5.10)
RDW: 12.6 % (ref 11.0–15.0)
Total Lymphocyte: 33.9 %
WBC: 7.6 10*3/uL (ref 3.8–10.8)

## 2018-12-15 LAB — LIPID PANEL
Cholesterol: 158 mg/dL (ref <200)
HDL: 59 mg/dL (ref >50)
LDL Cholesterol (Calc): 81 mg/dL (calc)
Non-HDL Cholesterol (Calc): 99 mg/dL (calc) (ref <130)
Total CHOL/HDL Ratio: 2.7 (calc) (ref <5.0)
Triglycerides: 97 mg/dL (ref <150)

## 2018-12-15 LAB — TSH: TSH: 14.17 mIU/L — ABNORMAL HIGH (ref 0.40–4.50)

## 2018-12-15 LAB — HEMOGLOBIN A1C
Hgb A1c MFr Bld: 5.8 % of total Hgb — ABNORMAL HIGH (ref <5.7)
Mean Plasma Glucose: 120 (calc)
eAG (mmol/L): 6.6 (calc)

## 2018-12-25 ENCOUNTER — Ambulatory Visit: Payer: Medicare Other | Admitting: Family Medicine

## 2019-01-01 ENCOUNTER — Encounter: Payer: Self-pay | Admitting: Family Medicine

## 2019-01-01 ENCOUNTER — Ambulatory Visit (INDEPENDENT_AMBULATORY_CARE_PROVIDER_SITE_OTHER): Payer: Medicare Other | Admitting: Family Medicine

## 2019-01-01 VITALS — BP 128/68 | HR 70 | Temp 97.8°F | Resp 12 | Wt 135.0 lb

## 2019-01-01 DIAGNOSIS — M25571 Pain in right ankle and joints of right foot: Secondary | ICD-10-CM | POA: Diagnosis not present

## 2019-01-01 MED ORDER — DOXYCYCLINE HYCLATE 100 MG PO TABS: 100.0000 mg | ORAL_TABLET | Freq: Two times a day (BID) | ORAL | 0 refills | Status: DC

## 2019-01-01 NOTE — Progress Notes (Signed)
Subjective:    Patient ID: Taylor BalesSusie C Sawyer, female    DOB: 05-31-1930, 85 y.o.   MRN: [id number]  Medication Refill    06/13/18 Patient has not been seen since October. PMH significant for tobacco abuse (ongoing), COPD, diabetes mellitus type 2 on metformin, hypothyroidism on levothyroxine, HLD, and ess HTN.  Daughter is concerned about memory loss.  Daughter states that over the last several months, patient has become increasingly forgetful.  She is demonstrating short-term memory deficits.  She is becoming more confused at night.  She is becoming more lethargic and more sedentary and refusing to leave the home.  Patient denies any depression.  She denies feeling sad.  I performed a Mini-Mental status exam on the patient today.  She told me the wrong date.  She is unable to tell me the correct day of the week, the correct month or the correct year.  She does get the correct season.  She scores 1 out of 5 on date.  She scores 5 out of 5 on location although she is unable to remember my name.  She is able to remember 3 objects on recall but she is unable to perform serial sevens or spell world in reverse.  She is able to draw intersecting pentagons and perform the rest of the Mini-Mental status exam.  She is unable to perform the clock drawing task.  She drawls numbers 12 through 5 becomes distracted and forgets what she was asked to do.  She does not even draw the hands on the clock.  She scores 20 out of 30 on Mini-Mental status exam.  Otherwise she is here for her regular follow-up.  She is overdue for a TSH to check her thyroid, overdue for hemoglobin A1c to check her diabetes.  Her blood pressure today is well controlled at 134/74.  She denies any chest pain shortness of breath or dyspnea on exertion.  At that time, my plan was: Patient is demonstrating mild to moderate dementia.  Begin Aricept 10 mg a day and check TSH along with vitamin B12.  Regarding her hypothyroidism, check a TSH to ensure  adequate doses of her levothyroxine.  Her blood pressure is adequately controlled.  Regarding her diabetes, I will check a hemoglobin A1c.  Goal hemoglobin A1c would be less than 7.  I will also check a fasting lipid panel.  Goal fasting lipid panel is less than an LDL of 100.  Patient has no desire to quit smoking.  Spent more than 30 minutes with the patient and her family discussing strategies to prevent falls, wandering, and help ensure safety at home with a person with dementia.  Reassess the patient in 6 months.  07/27/18 After last visit, patient's B12 values were found to be low and parenteral B12 was recommended in addition to Aricept for dementia.  She is also experienced several falls since I last saw her.  She saw my partner.  X-rays of both shoulders were negative for any fracture.  X-ray of the right hand was negative for fracture.  CT scan of the brain revealed no intracranial hemorrhage or normal pressure hydrocephalus.  Patient was unable to tolerate Aricept.  Aricept gave her terrible diarrhea.  Furthermore, the patient's daughter states that she became very withdrawn and noncommunicative on the Aricept.  Therefore they discontinued the medication.  Off the medication, the diarrhea has subsided and she is interacting more and seems to be closer to her baseline.  She still demonstrates significant short-term memory  problems.  For instance, her daughter asked her to visited her yesterday and the patient was unable to recollect that her daughter-in-law had come to see her or that her son-in-law come to see her.  At that time, my plan was: I believe the patient has Alzheimer's type dementia coupled with B12 deficiency.  She is currently on monthly B12 parenteral injections that her daughter is giving her.  However given her recent falls, her persistent short-term memory loss, I still believe that we need to initiate medication to delay progression.  Therefore I have recommended Namenda 5 mg p.o.  twice daily and reassess in 1 month.  Increase as tolerated to 10 mg p.o. twice daily  12/14/18 Here for follow up.  Patient's daughter states that she feels her memory stable perhaps even slightly better.  Recently a family friend has moved in with him and is conversing with the patient on a daily basis.  As a result, the patient has become more more conversant and is acting more like her normal self.  Although there is still baseline memory deficit that is apparent.  Approximately 2 to 3 weeks ago, the patient struck the lateral aspect of her right ankle directly over the lateral malleolus on an object.  That area now is red hot and swollen.  She has some mild tenderness to palpation in that area.  There is no palpable defect in the bone.  She has normal flexion and extension of the ankle without pain.  Inversion of the ankle elicits pain.  Eversion of the ankle does not elicit any pain.  I suspect a mild case of cellulitis given the redness the warmth and the pain.  She is also here today for follow-up of her underlying medical problems.  She has a history of hyperlipidemia for which she takes simvastatin 20 mg a day, she has hypothyroidism for which she takes levothyroxine 75 mcg daily, she has COPD/emphysema for which she takes Spiriva 1 inhalation a day, she has hypertension for which she takes losartan 100 mg a day, and she has diabetes for which she takes metformin.  Patient denies any chest pain shortness of breath or dyspnea on exertion.  They deny any hypoglycemic episodes.  They deny any hypoglycemia. At that time, my plan was: I am concerned the patient has developed a secondary cellulitis over her right lateral malleolus.  Begin Keflex 500 mg p.o. 3 times daily for 7 days.  Proceed with an x-ray of the ankle given her underlying history of diabetes and peripheral vascular disease to rule out possible underlying osteomyelitis.  Patient's dementia is stable.  I will increase her Namenda to 10 mg  twice daily.  Regarding her diabetes and hypothyroidism, I will check a CBC, CMP, hemoglobin A1c, TSH, and panel.  Await the results of her lab work.  01/01/19 X-ray showed degenerative changes of the ankle but no evidence of osteomyelitis or fracture.  She is completed the Keflex and has been off antibiotics now for approximately 1 week.  She continues to complain of some tenderness and pain over the right lateral malleolus.  The skin there is now pink.  It is not red or hot as previously before.  However it is still point tender to palpation.  The pink is non-blanchable.  Patient's family states that she sleeps on that side every night and that they believe it could be pressure against the ankle causing the pain.  Family also mentions that the patient had a few days of  what they believe is vaginal spotting while she was on antibiotics.  The reason they believe this is a salt drops of blood on her pad that goes in her underwear.  They are uncertain if it was from her vagina or her rectum.  However the patient with her dementia is agitated today.  She was agitated driving to the doctor's office.  Therefore they do not believe that she will be able to do a pelvic exam. Past Medical History:  Diagnosis Date  . Asthma   . B12 deficiency   . COPD (chronic obstructive pulmonary disease) (HCC)   . Diabetes mellitus   . Hyperlipidemia   . Hypertension   . Hypothyroidism   . Memory loss   . Tobacco user     Current Outpatient Medications on File Prior to Visit  Medication Sig Dispense Refill  . cyanocobalamin (,VITAMIN B-12,) 1000 MCG/ML injection Inject 1 ml weekly x 2 weeks then 1 ml monthly 10 mL 2  . fluticasone (FLONASE) 50 MCG/ACT nasal spray Place 2 sprays into both nostrils daily. 16 g 6  . levothyroxine (SYNTHROID, LEVOTHROID) 75 MCG tablet TAKE 1 TABLET BY MOUTH ONCE DAILY 90 tablet 3  . losartan (COZAAR) 100 MG tablet TAKE 1 TABLET BY MOUTH ONCE DAILY 90 tablet 3  . memantine (NAMENDA) 10 MG  tablet Take 1 tablet (10 mg total) by mouth 2 (two) times daily. 180 tablet 1  . metFORMIN (GLUCOPHAGE) 500 MG tablet TAKE ONE TABLET BY MOUTH TWICE DAILY WITH MEALS 180 tablet 1  . simvastatin (ZOCOR) 20 MG tablet Take 1 tablet (20 mg total) by mouth at bedtime. 90 tablet 1  . SYRINGE-NEEDLE, DISP, 3 ML (LUER LOCK SAFETY SYRINGES) 25G X 1" 3 ML MISC Use with B12 injections 50 each 1  . tiotropium (SPIRIVA HANDIHALER) 18 MCG inhalation capsule USE 1 INHALATION ONCE DAILY AS INSTRUCTED 30 capsule 3  . albuterol (PROVENTIL HFA;VENTOLIN HFA) 108 (90 Base) MCG/ACT inhaler Inhale 2 puffs into the lungs every 6 (six) hours as needed for wheezing or shortness of breath. (Patient not taking: Reported on 06/13/2018) 1 Inhaler 0   No current facility-administered medications on file prior to visit.    Allergies  Allergen Reactions  . Prednisone     Cramps.   Social History   Socioeconomic History  . Marital status: Widowed    Spouse name: Not on file  . Number of children: Not on file  . Years of education: Not on file  . Highest education level: Not on file  Occupational History  . Not on file  Social Needs  . Financial resource strain: Not on file  . Food insecurity:    Worry: Not on file    Inability: Not on file  . Transportation needs:    Medical: Not on file    Non-medical: Not on file  Tobacco Use  . Smoking status: Current Every Day Smoker  . Smokeless tobacco: Never Used  Substance and Sexual Activity  . Alcohol use: Not on file  . Drug use: Not on file  . Sexual activity: Not on file  Lifestyle  . Physical activity:    Days per week: Not on file    Minutes per session: Not on file  . Stress: Not on file  Relationships  . Social connections:    Talks on phone: Not on file    Gets together: Not on file    Attends religious service: Not on file    Active member of  club or organization: Not on file    Attends meetings of clubs or organizations: Not on file     Relationship status: Not on file  . Intimate partner violence:    Fear of current or ex partner: Not on file    Emotionally abused: Not on file    Physically abused: Not on file    Forced sexual activity: Not on file  Other Topics Concern  . Not on file  Social History Narrative  . Not on file     Review of Systems  All other systems reviewed and are negative.      Objective:   Physical Exam  HENT:  Right Ear: External ear normal.  Left Ear: External ear normal.  Nose: Nose normal.  Mouth/Throat: Oropharynx is clear and moist. No oropharyngeal exudate.  Eyes: Conjunctivae are normal.  Neck: Neck supple.  Cardiovascular: Normal rate, regular rhythm and normal heart sounds.  Pulmonary/Chest: Effort normal and breath sounds normal. She has no wheezes. She has no rhonchi. She has no rales.  Abdominal: Soft. Bowel sounds are normal. She exhibits no distension. There is no abdominal tenderness. There is no rebound and no guarding.  Musculoskeletal:     Right ankle: She exhibits swelling. Tenderness. Lateral malleolus tenderness found. No medial malleolus and no proximal fibula tenderness found.       Feet:  Lymphadenopathy:    She has no cervical adenopathy.  Vitals reviewed.   Redness, heat, and tenderness over the right lateral malleolus has improved.  Skin is still pink and tender to the touch      Assessment & Plan:  Acute right ankle pain Differential diagnosis includes stage 1 pressure sore over the lateral malleolus with secondary infection versus underlying chronic osteomyelitis.  After discussing with the family we have like to treat using a combination of DuoDERM to offload the pressure from the ankle while she sleeps and extend antibiotics for 10 additional days since it has improved with doxycycline 100 mg p.o. twice daily for 10 days and then recheck in 10 days.  If no better at that point, I would recommend an MRI to evaluate for chronic osteomyelitis given I  suspect her peripheral vascular disease.  Regarding the spotting, I have recommended a pelvic exam however given her dementia we do not believe we can do this today.  Therefore they will bring her back in 10 days to recheck her ankle.  At that point if she is still experiencing spotting, we will proceed with a pelvic exam to evaluate for any evidence of why she had the spotting.  Next that would be a GYN referral for endometrial biopsy and ultrasound if in fact this is vaginal bleeding.

## 2019-01-11 ENCOUNTER — Ambulatory Visit (INDEPENDENT_AMBULATORY_CARE_PROVIDER_SITE_OTHER): Payer: Medicare Other | Admitting: Family Medicine

## 2019-01-11 ENCOUNTER — Encounter: Payer: Self-pay | Admitting: Family Medicine

## 2019-01-11 VITALS — BP 150/92 | HR 84 | Temp 97.6°F | Resp 14 | Wt 130.0 lb

## 2019-01-11 DIAGNOSIS — R319 Hematuria, unspecified: Secondary | ICD-10-CM

## 2019-01-11 LAB — URINALYSIS, ROUTINE W REFLEX MICROSCOPIC
Bilirubin Urine: NEGATIVE
Glucose, UA: NEGATIVE
Ketones, ur: NEGATIVE
Nitrite: NEGATIVE
Specific Gravity, Urine: 1.02 (ref 1.001–1.03)
pH: 5.5 (ref 5.0–8.0)

## 2019-01-11 LAB — MICROSCOPIC MESSAGE

## 2019-01-11 MED ORDER — SULFAMETHOXAZOLE-TRIMETHOPRIM 800-160 MG PO TABS: 1.0000 | ORAL_TABLET | Freq: Two times a day (BID) | ORAL | 0 refills | Status: DC

## 2019-01-11 NOTE — Progress Notes (Signed)
Subjective:    Patient ID: Taylor Sawyer, female    DOB: 05-31-1930, 85 y.o.   MRN: [id number]  Medication Refill    06/13/18 Patient has not been seen since October. PMH significant for tobacco abuse (ongoing), COPD, diabetes mellitus type 2 on metformin, hypothyroidism on levothyroxine, HLD, and ess HTN.  Daughter is concerned about memory loss.  Daughter states that over the last several months, patient has become increasingly forgetful.  She is demonstrating short-term memory deficits.  She is becoming more confused at night.  She is becoming more lethargic and more sedentary and refusing to leave the home.  Patient denies any depression.  She denies feeling sad.  I performed a Mini-Mental status exam on the patient today.  She told me the wrong date.  She is unable to tell me the correct day of the week, the correct month or the correct year.  She does get the correct season.  She scores 1 out of 5 on date.  She scores 5 out of 5 on location although she is unable to remember my name.  She is able to remember 3 objects on recall but she is unable to perform serial sevens or spell world in reverse.  She is able to draw intersecting pentagons and perform the rest of the Mini-Mental status exam.  She is unable to perform the clock drawing task.  She drawls numbers 12 through 5 becomes distracted and forgets what she was asked to do.  She does not even draw the hands on the clock.  She scores 20 out of 30 on Mini-Mental status exam.  Otherwise she is here for her regular follow-up.  She is overdue for a TSH to check her thyroid, overdue for hemoglobin A1c to check her diabetes.  Her blood pressure today is well controlled at 134/74.  She denies any chest pain shortness of breath or dyspnea on exertion.  At that time, my plan was: Patient is demonstrating mild to moderate dementia.  Begin Aricept 10 mg a day and check TSH along with vitamin B12.  Regarding her hypothyroidism, check a TSH to ensure  adequate doses of her levothyroxine.  Her blood pressure is adequately controlled.  Regarding her diabetes, I will check a hemoglobin A1c.  Goal hemoglobin A1c would be less than 7.  I will also check a fasting lipid panel.  Goal fasting lipid panel is less than an LDL of 100.  Patient has no desire to quit smoking.  Spent more than 30 minutes with the patient and her family discussing strategies to prevent falls, wandering, and help ensure safety at home with a person with dementia.  Reassess the patient in 6 months.  07/27/18 After last visit, patient's B12 values were found to be low and parenteral B12 was recommended in addition to Aricept for dementia.  She is also experienced several falls since I last saw her.  She saw my partner.  X-rays of both shoulders were negative for any fracture.  X-ray of the right hand was negative for fracture.  CT scan of the brain revealed no intracranial hemorrhage or normal pressure hydrocephalus.  Patient was unable to tolerate Aricept.  Aricept gave her terrible diarrhea.  Furthermore, the patient's daughter states that she became very withdrawn and noncommunicative on the Aricept.  Therefore they discontinued the medication.  Off the medication, the diarrhea has subsided and she is interacting more and seems to be closer to her baseline.  She still demonstrates significant short-term memory  problems.  For instance, her daughter asked her to visited her yesterday and the patient was unable to recollect that her daughter-in-law had come to see her or that her son-in-law come to see her.  At that time, my plan was: I believe the patient has Alzheimer's type dementia coupled with B12 deficiency.  She is currently on monthly B12 parenteral injections that her daughter is giving her.  However given her recent falls, her persistent short-term memory loss, I still believe that we need to initiate medication to delay progression.  Therefore I have recommended Namenda 5 mg p.o.  twice daily and reassess in 1 month.  Increase as tolerated to 10 mg p.o. twice daily  12/14/18 Here for follow up.  Patient's daughter states that she feels her memory stable perhaps even slightly better.  Recently a family friend has moved in with him and is conversing with the patient on a daily basis.  As a result, the patient has become more more conversant and is acting more like her normal self.  Although there is still baseline memory deficit that is apparent.  Approximately 2 to 3 weeks ago, the patient struck the lateral aspect of her right ankle directly over the lateral malleolus on an object.  That area now is red hot and swollen.  She has some mild tenderness to palpation in that area.  There is no palpable defect in the bone.  She has normal flexion and extension of the ankle without pain.  Inversion of the ankle elicits pain.  Eversion of the ankle does not elicit any pain.  I suspect a mild case of cellulitis given the redness the warmth and the pain.  She is also here today for follow-up of her underlying medical problems.  She has a history of hyperlipidemia for which she takes simvastatin 20 mg a day, she has hypothyroidism for which she takes levothyroxine 75 mcg daily, she has COPD/emphysema for which she takes Spiriva 1 inhalation a day, she has hypertension for which she takes losartan 100 mg a day, and she has diabetes for which she takes metformin.  Patient denies any chest pain shortness of breath or dyspnea on exertion.  They deny any hypoglycemic episodes.  They deny any hypoglycemia. At that time, my plan was: I am concerned the patient has developed a secondary cellulitis over her right lateral malleolus.  Begin Keflex 500 mg p.o. 3 times daily for 7 days.  Proceed with an x-ray of the ankle given her underlying history of diabetes and peripheral vascular disease to rule out possible underlying osteomyelitis.  Patient's dementia is stable.  I will increase her Namenda to 10 mg  twice daily.  Regarding her diabetes and hypothyroidism, I will check a CBC, CMP, hemoglobin A1c, TSH, and panel.  Await the results of her lab work.  01/01/19 X-ray showed degenerative changes of the ankle but no evidence of osteomyelitis or fracture.  She is completed the Keflex and has been off antibiotics now for approximately 1 week.  She continues to complain of some tenderness and pain over the right lateral malleolus.  The skin there is now pink.  It is not red or hot as previously before.  However it is still point tender to palpation.  The pink is non-blanchable.  Patient's family states that she sleeps on that side every night and that they believe it could be pressure against the ankle causing the pain.  Family also mentions that the patient had a few days of  what they believe is vaginal spotting while she was on antibiotics.  The reason they believe this is a salt drops of blood on her pad that goes in her underwear.  They are uncertain if it was from her vagina or her rectum.  However the patient with her dementia is agitated today.  She was agitated driving to the doctor's office.  Therefore they do not believe that she will be able to do a pelvic exam.  At that time, my plan was: Differential diagnosis includes stage 1 pressure sore over the lateral malleolus with secondary infection versus underlying chronic osteomyelitis.  After discussing with the family we have like to treat using a combination of DuoDERM to offload the pressure from the ankle while she sleeps and extend antibiotics for 10 additional days since it has improved with doxycycline 100 mg p.o. twice daily for 10 days and then recheck in 10 days.  If no better at that point, I would recommend an MRI to evaluate for chronic osteomyelitis given I suspect her peripheral vascular disease.  Regarding the spotting, I have recommended a pelvic exam however given her dementia we do not believe we can do this today.  Therefore they will  bring her back in 10 days to recheck her ankle.  At that point if she is still experiencing spotting, we will proceed with a pelvic exam to evaluate for any evidence of why she had the spotting.  Next that would be a GYN referral for endometrial biopsy and ultrasound if in fact this is vaginal bleeding.   01/11/19 Ankle looks much better.  The skin over the lateral malleolus is still violaceous in color and nonblanching however I believe this is a stage I pressure sore due to the fact she lays on that side every night.  There is no pain with range of motion in the ankle.  There is no pain with inversion or eversion or flexion or extension.  There is no tenderness to palpation over the lateral malleolus.  Therefore I do not believe there is any evidence of osteomyelitis.  Now the family believes that the blood is coming from her urine.  They state that when she goes to the bathroom and urinates there is blood in the toilet.  She is also having blood in her depends after she is incontinent of urine.  Urinalysis today shows +2 blood as well as +2 leukocyte esterase.  She has left-sided CVA tenderness.  There is no tenderness to palpation over her lower abdomen or over her pelvis. Past Medical History:  Diagnosis Date  . Asthma   . B12 deficiency   . COPD (chronic obstructive pulmonary disease) (HCC)   . Diabetes mellitus   . Hyperlipidemia   . Hypertension   . Hypothyroidism   . Memory loss   . Tobacco user     Current Outpatient Medications on File Prior to Visit  Medication Sig Dispense Refill  . albuterol (PROVENTIL HFA;VENTOLIN HFA) 108 (90 Base) MCG/ACT inhaler Inhale 2 puffs into the lungs every 6 (six) hours as needed for wheezing or shortness of breath. (Patient not taking: Reported on 06/13/2018) 1 Inhaler 0  . cyanocobalamin (,VITAMIN B-12,) 1000 MCG/ML injection Inject 1 ml weekly x 2 weeks then 1 ml monthly 10 mL 2  . doxycycline (VIBRA-TABS) 100 MG tablet Take 1 tablet (100 mg total)  by mouth 2 (two) times daily. 20 tablet 0  . fluticasone (FLONASE) 50 MCG/ACT nasal spray Place 2 sprays into both nostrils  daily. 16 g 6  . levothyroxine (SYNTHROID, LEVOTHROID) 75 MCG tablet TAKE 1 TABLET BY MOUTH ONCE DAILY 90 tablet 3  . losartan (COZAAR) 100 MG tablet TAKE 1 TABLET BY MOUTH ONCE DAILY 90 tablet 3  . memantine (NAMENDA) 10 MG tablet Take 1 tablet (10 mg total) by mouth 2 (two) times daily. 180 tablet 1  . metFORMIN (GLUCOPHAGE) 500 MG tablet TAKE ONE TABLET BY MOUTH TWICE DAILY WITH MEALS 180 tablet 1  . simvastatin (ZOCOR) 20 MG tablet Take 1 tablet (20 mg total) by mouth at bedtime. 90 tablet 1  . SYRINGE-NEEDLE, DISP, 3 ML (LUER LOCK SAFETY SYRINGES) 25G X 1" 3 ML MISC Use with B12 injections 50 each 1  . tiotropium (SPIRIVA HANDIHALER) 18 MCG inhalation capsule USE 1 INHALATION ONCE DAILY AS INSTRUCTED 30 capsule 3   No current facility-administered medications on file prior to visit.    Allergies  Allergen Reactions  . Prednisone     Cramps.   Social History   Socioeconomic History  . Marital status: Widowed    Spouse name: Not on file  . Number of children: Not on file  . Years of education: Not on file  . Highest education level: Not on file  Occupational History  . Not on file  Social Needs  . Financial resource strain: Not on file  . Food insecurity:    Worry: Not on file    Inability: Not on file  . Transportation needs:    Medical: Not on file    Non-medical: Not on file  Tobacco Use  . Smoking status: Current Every Day Smoker  . Smokeless tobacco: Never Used  Substance and Sexual Activity  . Alcohol use: Not on file  . Drug use: Not on file  . Sexual activity: Not on file  Lifestyle  . Physical activity:    Days per week: Not on file    Minutes per session: Not on file  . Stress: Not on file  Relationships  . Social connections:    Talks on phone: Not on file    Gets together: Not on file    Attends religious service: Not on file     Active member of club or organization: Not on file    Attends meetings of clubs or organizations: Not on file    Relationship status: Not on file  . Intimate partner violence:    Fear of current or ex partner: Not on file    Emotionally abused: Not on file    Physically abused: Not on file    Forced sexual activity: Not on file  Other Topics Concern  . Not on file  Social History Narrative  . Not on file     Review of Systems  All other systems reviewed and are negative.      Objective:   Physical Exam  HENT:  Right Ear: External ear normal.  Left Ear: External ear normal.  Nose: Nose normal.  Mouth/Throat: Oropharynx is clear and moist. No oropharyngeal exudate.  Eyes: Conjunctivae are normal.  Neck: Neck supple.  Cardiovascular: Normal rate, regular rhythm and normal heart sounds.  Pulmonary/Chest: Effort normal and breath sounds normal. She has no wheezes. She has no rhonchi. She has no rales.  Abdominal: Soft. Bowel sounds are normal. She exhibits no distension. There is no abdominal tenderness. There is CVA tenderness. There is no rebound and no guarding.  Musculoskeletal:     Right ankle: She exhibits no swelling. No tenderness. No  lateral malleolus, no medial malleolus and no proximal fibula tenderness found.       Feet:  Lymphadenopathy:    She has no cervical adenopathy.  Vitals reviewed.         Assessment & Plan:  Hematuria, unspecified type - Plan: Urinalysis, Routine w reflex microscopic Unfortunately this seems to be hematuria.  This is gone on now for 1 to 2 weeks.  Urinalysis suggest possible urinary tract infection particular given the CVA tenderness.  I will treat her with Bactrim double strength tablets 1 p.o. twice daily for 7 days.  Meanwhile, obtain CT scan to evaluate for causes of hematuria particular given the fact she is lost 5 pounds and is lost her appetite.

## 2019-01-11 NOTE — Addendum Note (Signed)
Addended by: Legrand Rams B on: 01/11/2019 04:07 PM   Modules accepted: Orders

## 2019-01-12 LAB — URINE CULTURE
MICRO NUMBER:: 65436
SPECIMEN QUALITY:: ADEQUATE

## 2019-01-15 ENCOUNTER — Telehealth: Payer: Self-pay | Admitting: *Deleted

## 2019-01-15 DIAGNOSIS — R319 Hematuria, unspecified: Secondary | ICD-10-CM

## 2019-01-15 NOTE — Telephone Encounter (Signed)
[facility] [phone]~ telephone.   Reports that Cr (1.37) and GFR (35) are out of range to be able to have CT ABD/Pelvis with and without contrast. States that we can hydrate patient and retry after labs drawn again, or order can be changed to just without contrast.   Please advise.

## 2019-01-16 ENCOUNTER — Ambulatory Visit
Admission: RE | Admit: 2019-01-16 | Discharge: 2019-01-16 | Disposition: A | Discharge status: Home / Self Care | Payer: Medicare Other | Source: Ambulatory Visit | Attending: Family Medicine | Admitting: Family Medicine

## 2019-01-16 DIAGNOSIS — N281 Cyst of kidney, acquired: Secondary | ICD-10-CM | POA: Diagnosis not present

## 2019-01-16 DIAGNOSIS — R319 Hematuria, unspecified: Secondary | ICD-10-CM

## 2019-01-16 NOTE — Telephone Encounter (Signed)
Orders placed.

## 2019-01-16 NOTE — Telephone Encounter (Signed)
Change to without contrast

## 2019-01-19 ENCOUNTER — Other Ambulatory Visit: Payer: Self-pay | Admitting: Family Medicine

## 2019-01-19 DIAGNOSIS — N2889 Other specified disorders of kidney and ureter: Secondary | ICD-10-CM

## 2019-01-19 DIAGNOSIS — R319 Hematuria, unspecified: Secondary | ICD-10-CM

## 2019-02-17 ENCOUNTER — Other Ambulatory Visit: Payer: Self-pay | Admitting: Family Medicine

## 2019-03-26 DIAGNOSIS — R52 Pain, unspecified: Secondary | ICD-10-CM | POA: Diagnosis not present

## 2019-03-26 DIAGNOSIS — S32020A Wedge compression fracture of second lumbar vertebra, initial encounter for closed fracture: Secondary | ICD-10-CM | POA: Diagnosis not present

## 2019-03-26 DIAGNOSIS — N3090 Cystitis, unspecified without hematuria: Secondary | ICD-10-CM | POA: Diagnosis not present

## 2019-03-26 DIAGNOSIS — N3000 Acute cystitis without hematuria: Secondary | ICD-10-CM | POA: Diagnosis not present

## 2019-03-26 DIAGNOSIS — M5489 Other dorsalgia: Secondary | ICD-10-CM | POA: Diagnosis not present

## 2019-03-26 DIAGNOSIS — R410 Disorientation, unspecified: Secondary | ICD-10-CM | POA: Diagnosis not present

## 2019-03-26 DIAGNOSIS — E860 Dehydration: Secondary | ICD-10-CM | POA: Diagnosis not present

## 2019-03-26 DIAGNOSIS — S0990XA Unspecified injury of head, initial encounter: Secondary | ICD-10-CM | POA: Diagnosis not present

## 2019-03-26 DIAGNOSIS — F0390 Unspecified dementia without behavioral disturbance: Secondary | ICD-10-CM | POA: Diagnosis not present

## 2019-03-26 DIAGNOSIS — R1084 Generalized abdominal pain: Secondary | ICD-10-CM | POA: Diagnosis not present

## 2019-03-26 DIAGNOSIS — M25552 Pain in left hip: Secondary | ICD-10-CM | POA: Diagnosis not present

## 2019-03-26 DIAGNOSIS — N289 Disorder of kidney and ureter, unspecified: Secondary | ICD-10-CM | POA: Diagnosis not present

## 2019-03-26 DIAGNOSIS — M545 Low back pain: Secondary | ICD-10-CM | POA: Diagnosis not present

## 2019-03-26 DIAGNOSIS — R109 Unspecified abdominal pain: Secondary | ICD-10-CM | POA: Diagnosis not present

## 2019-03-27 ENCOUNTER — Other Ambulatory Visit: Payer: Self-pay | Admitting: Family Medicine

## 2019-04-03 ENCOUNTER — Telehealth: Payer: Self-pay | Admitting: Family Medicine

## 2019-04-03 MED ORDER — OXYCODONE-ACETAMINOPHEN 5-325 MG PO TABS: 1.0000 | ORAL_TABLET | ORAL | 0 refills | Status: DC | PRN

## 2019-04-03 NOTE — Telephone Encounter (Signed)
Pt went to [facility] and was told she has old compression fx on L2 and a UTI. She can not walk hardly at all and dtr wanted to know if we could give her something for the pain?  She did have some Tramadol at the house that did not help at all.

## 2019-04-03 NOTE — Telephone Encounter (Signed)
Will send percocet 5/325 1 tab poq4 hrs prn pain (30)

## 2019-04-03 NOTE — Telephone Encounter (Signed)
Pam aware.

## 2019-04-20 ENCOUNTER — Other Ambulatory Visit: Payer: Self-pay | Admitting: Family Medicine

## 2019-04-20 ENCOUNTER — Telehealth: Payer: Self-pay | Admitting: Family Medicine

## 2019-04-20 MED ORDER — CEPHALEXIN 500 MG PO CAPS: 500.0000 mg | ORAL_CAPSULE | Freq: Three times a day (TID) | ORAL | 0 refills | Status: DC

## 2019-04-20 NOTE — Telephone Encounter (Signed)
Pt needs [name] to refill her keflex for uti, Taylor Sawyer. Painful urination, dark urine.

## 2019-04-20 NOTE — Telephone Encounter (Signed)
ok 

## 2019-04-20 NOTE — Telephone Encounter (Signed)
Med sent and pam aware

## 2019-05-03 ENCOUNTER — Other Ambulatory Visit: Payer: Self-pay | Admitting: Family Medicine

## 2019-05-03 DIAGNOSIS — J440 Chronic obstructive pulmonary disease with acute lower respiratory infection: Principal | ICD-10-CM

## 2019-05-03 DIAGNOSIS — J209 Acute bronchitis, unspecified: Secondary | ICD-10-CM

## 2019-05-03 MED ORDER — TIOTROPIUM BROMIDE MONOHYDRATE 18 MCG IN CAPS: ORAL_CAPSULE | RESPIRATORY_TRACT | 3 refills | Status: DC

## 2019-05-31 ENCOUNTER — Other Ambulatory Visit: Payer: Self-pay | Admitting: Family Medicine

## 2019-06-11 ENCOUNTER — Other Ambulatory Visit: Payer: Self-pay | Admitting: Family Medicine

## 2019-06-30 ENCOUNTER — Other Ambulatory Visit: Payer: Self-pay | Admitting: Family Medicine

## 2019-07-25 ENCOUNTER — Other Ambulatory Visit: Payer: Self-pay | Admitting: Family Medicine

## 2019-09-23 ENCOUNTER — Other Ambulatory Visit: Payer: Self-pay | Admitting: Family Medicine

## 2019-09-29 ENCOUNTER — Other Ambulatory Visit: Payer: Self-pay | Admitting: Family Medicine

## 2019-10-29 ENCOUNTER — Other Ambulatory Visit: Payer: Self-pay | Admitting: Family Medicine

## 2019-11-02 ENCOUNTER — Telehealth: Payer: Self-pay | Admitting: Family Medicine

## 2019-11-02 ENCOUNTER — Other Ambulatory Visit: Payer: Self-pay | Admitting: Family Medicine

## 2019-11-02 MED ORDER — CEPHALEXIN 500 MG PO CAPS: 500.0000 mg | ORAL_CAPSULE | Freq: Three times a day (TID) | ORAL | 0 refills | Status: DC

## 2019-11-02 NOTE — Telephone Encounter (Signed)
Daughter called and states that the pt c/o burning with urination off and on. No fever. Would like to know if we would call in an antibx for her since going into the weekend cause she does not want to take her to the hosp.  Per Dr. Madlyn Frankel. Med sent and dtr aware.

## 2019-12-07 ENCOUNTER — Telehealth: Payer: Self-pay | Admitting: Family Medicine

## 2019-12-07 NOTE — Telephone Encounter (Signed)
Pam called and states that she thinks pt has another UTI as she is burning with urination and strong odor. Wanted to know if we would call her in an antibx?

## 2019-12-10 ENCOUNTER — Other Ambulatory Visit: Payer: Self-pay | Admitting: Family Medicine

## 2019-12-10 MED ORDER — SULFAMETHOXAZOLE-TRIMETHOPRIM 800-160 MG PO TABS: 1.0000 | ORAL_TABLET | Freq: Two times a day (BID) | ORAL | 0 refills | Status: DC

## 2019-12-10 NOTE — Telephone Encounter (Signed)
Pam aware.

## 2019-12-10 NOTE — Telephone Encounter (Signed)
Just got message, I called in abx.

## 2020-03-12 ENCOUNTER — Telehealth: Payer: Self-pay | Admitting: Family Medicine

## 2020-03-12 MED ORDER — CYANOCOBALAMIN 1000 MCG/ML IJ SOLN: INTRAMUSCULAR | 2 refills | Status: AC

## 2020-03-12 NOTE — Telephone Encounter (Signed)
Patient needs b12 rx sent to [facility]

## 2020-04-15 ENCOUNTER — Other Ambulatory Visit: Payer: Self-pay | Admitting: Family Medicine

## 2020-05-15 ENCOUNTER — Other Ambulatory Visit: Payer: Self-pay | Admitting: Family Medicine

## 2020-05-19 ENCOUNTER — Other Ambulatory Visit: Payer: Self-pay | Admitting: Family Medicine

## 2020-05-20 ENCOUNTER — Other Ambulatory Visit: Payer: Self-pay | Admitting: Family Medicine

## 2020-06-20 ENCOUNTER — Encounter: Payer: Medicare Other | Admitting: Family Medicine

## 2020-07-28 ENCOUNTER — Encounter: Payer: Medicare Other | Admitting: Family Medicine

## 2020-08-01 ENCOUNTER — Other Ambulatory Visit: Payer: Self-pay | Admitting: Family Medicine

## 2020-08-01 DIAGNOSIS — E039 Hypothyroidism, unspecified: Secondary | ICD-10-CM

## 2020-08-22 ENCOUNTER — Other Ambulatory Visit: Payer: Self-pay

## 2020-08-22 ENCOUNTER — Ambulatory Visit (INDEPENDENT_AMBULATORY_CARE_PROVIDER_SITE_OTHER): Payer: Medicare Other | Admitting: Family Medicine

## 2020-08-22 VITALS — BP 140/80 | HR 89 | Temp 96.5°F | Wt 119.0 lb

## 2020-08-22 DIAGNOSIS — E039 Hypothyroidism, unspecified: Secondary | ICD-10-CM | POA: Diagnosis not present

## 2020-08-22 DIAGNOSIS — E119 Type 2 diabetes mellitus without complications: Secondary | ICD-10-CM

## 2020-08-22 DIAGNOSIS — I10 Essential (primary) hypertension: Secondary | ICD-10-CM

## 2020-08-22 DIAGNOSIS — J431 Panlobular emphysema: Secondary | ICD-10-CM

## 2020-08-22 DIAGNOSIS — R300 Dysuria: Secondary | ICD-10-CM

## 2020-08-22 DIAGNOSIS — R413 Other amnesia: Secondary | ICD-10-CM

## 2020-08-22 DIAGNOSIS — Z0001 Encounter for general adult medical examination with abnormal findings: Secondary | ICD-10-CM | POA: Diagnosis not present

## 2020-08-22 MED ORDER — TRELEGY ELLIPTA 100-62.5-25 MCG/INH IN AEPB: 1.0000 | INHALATION_SPRAY | Freq: Every day | RESPIRATORY_TRACT | 11 refills | Status: AC

## 2020-08-22 NOTE — Progress Notes (Signed)
Subjective:    Patient ID: Taylor Sawyer, female    DOB: 1930-04-18, 86 y.o.   MRN: [id number]  Medication Refill   06/13/18 Patient has not been seen since October. PMH significant for tobacco abuse (ongoing), COPD, diabetes mellitus type 2 on metformin, hypothyroidism on levothyroxine, HLD, and ess HTN.  Daughter is concerned about memory loss.  Daughter states that over the last several months, patient has become increasingly forgetful.  She is demonstrating short-term memory deficits.  She is becoming more confused at night.  She is becoming more lethargic and more sedentary and refusing to leave the home.  Patient denies any depression.  She denies feeling sad.  I performed a Mini-Mental status exam on the patient today.  She told me the wrong date.  She is unable to tell me the correct day of the week, the correct month or the correct year.  She does get the correct season.  She scores 1 out of 5 on date.  She scores 5 out of 5 on location although she is unable to remember my name.  She is able to remember 3 objects on recall but she is unable to perform serial sevens or spell world in reverse.  She is able to draw intersecting pentagons and perform the rest of the Mini-Mental status exam.  She is unable to perform the clock drawing task.  She drawls numbers 12 through 5 becomes distracted and forgets what she was asked to do.  She does not even draw the hands on the clock.  She scores 20 out of 30 on Mini-Mental status exam.  Otherwise she is here for her regular follow-up.  She is overdue for a TSH to check her thyroid, overdue for hemoglobin A1c to check her diabetes.  Her blood pressure today is well controlled at 134/74.  She denies any chest pain shortness of breath or dyspnea on exertion.  At that time, my plan was: Patient is demonstrating mild to moderate dementia.  Begin Aricept 10 mg a day and check TSH along with vitamin B12.  Regarding her hypothyroidism, check a TSH to ensure adequate  doses of her levothyroxine.  Her blood pressure is adequately controlled.  Regarding her diabetes, I will check a hemoglobin A1c.  Goal hemoglobin A1c would be less than 7.  I will also check a fasting lipid panel.  Goal fasting lipid panel is less than an LDL of 100.  Patient has no desire to quit smoking.  Spent more than 30 minutes with the patient and her family discussing strategies to prevent falls, wandering, and help ensure safety at home with a person with dementia.  Reassess the patient in 6 months.  07/27/18 After last visit, patient's B12 values were found to be low and parenteral B12 was recommended in addition to Aricept for dementia.  She is also experienced several falls since I last saw her.  She saw my partner.  X-rays of both shoulders were negative for any fracture.  X-ray of the right hand was negative for fracture.  CT scan of the brain revealed no intracranial hemorrhage or normal pressure hydrocephalus.  Patient was unable to tolerate Aricept.  Aricept gave her terrible diarrhea.  Furthermore, the patient's daughter states that she became very withdrawn and noncommunicative on the Aricept.  Therefore they discontinued the medication.  Off the medication, the diarrhea has subsided and she is interacting more and seems to be closer to her baseline.  She still demonstrates significant short-term memory problems.  For instance, her daughter asked her to visited her yesterday and the patient was unable to recollect that her daughter-in-law had come to see her or that her son-in-law come to see her.  At that time, my plan was: I believe the patient has Alzheimer's type dementia coupled with B12 deficiency.  She is currently on monthly B12 parenteral injections that her daughter is giving her.  However given her recent falls, her persistent short-term memory loss, I still believe that we need to initiate medication to delay progression.  Therefore I have recommended Namenda 5 mg p.o. twice daily  and reassess in 1 month.  Increase as tolerated to 10 mg p.o. twice daily  12/14/18 Here for follow up.  Patient's daughter states that she feels her memory stable perhaps even slightly better.  Recently a family friend has moved in with him and is conversing with the patient on a daily basis.  As a result, the patient has become more more conversant and is acting more like her normal self.  Although there is still baseline memory deficit that is apparent.  Approximately 2 to 3 weeks ago, the patient struck the lateral aspect of her right ankle directly over the lateral malleolus on an object.  That area now is red hot and swollen.  She has some mild tenderness to palpation in that area.  There is no palpable defect in the bone.  She has normal flexion and extension of the ankle without pain.  Inversion of the ankle elicits pain.  Eversion of the ankle does not elicit any pain.  I suspect a mild case of cellulitis given the redness the warmth and the pain.  She is also here today for follow-up of her underlying medical problems.  She has a history of hyperlipidemia for which she takes simvastatin 20 mg a day, she has hypothyroidism for which she takes levothyroxine 75 mcg daily, she has COPD/emphysema for which she takes Spiriva 1 inhalation a day, she has hypertension for which she takes losartan 100 mg a day, and she has diabetes for which she takes metformin.  Patient denies any chest pain shortness of breath or dyspnea on exertion.  They deny any hypoglycemic episodes.  They deny any hypoglycemia. At that time, my plan was: I am concerned the patient has developed a secondary cellulitis over her right lateral malleolus.  Begin Keflex 500 mg p.o. 3 times daily for 7 days.  Proceed with an x-ray of the ankle given her underlying history of diabetes and peripheral vascular disease to rule out possible underlying osteomyelitis.  Patient's dementia is stable.  I will increase her Namenda to 10 mg twice daily.   Regarding her diabetes and hypothyroidism, I will check a CBC, CMP, hemoglobin A1c, TSH, and panel.  Await the results of her lab work.   08/22/20 I have not see the patient in quite some time.  She is long overdue for fasting lab work.  Her dementia has progressed to the point that she no longer tells her family if she needs anything.  She no longer complains about any pain or discomfort or hunger.  She stays extremely quiet and does not talk very often.  Today during our encounter she does not even attempt to speak.  She has not had her Covid shot yet.  Family is concerned about her having the Covid shot.  Family states that she is smoking more than a pack of cigarettes per day.  They deny any weight loss or chest  pain or shortness of breath as best they can tail.  She does have increasing urinary and fecal incontinence.  They are concerned that she may have a urinary tract infection because she demonstrates dysuria.  Patient has end-stage dementia.  I am unable to perform a depression screening test.  Family denies any falls. Past Medical History:  Diagnosis Date  . Asthma   . B12 deficiency   . COPD (chronic obstructive pulmonary disease) (HCC)   . Diabetes mellitus   . Hyperlipidemia   . Hypertension   . Hypothyroidism   . Memory loss   . Tobacco user     Current Outpatient Medications on File Prior to Visit  Medication Sig Dispense Refill  . albuterol (PROVENTIL HFA;VENTOLIN HFA) 108 (90 Base) MCG/ACT inhaler Inhale 2 puffs into the lungs every 6 (six) hours as needed for wheezing or shortness of breath. (Patient not taking: Reported on 06/13/2018) 1 Inhaler 0  . cephALEXin (KEFLEX) 500 MG capsule Take 1 capsule (500 mg total) by mouth 3 (three) times daily. 21 capsule 0  . cyanocobalamin (,VITAMIN B-12,) 1000 MCG/ML injection Inject  1 ml IM monthly 10 mL 2  . doxycycline (VIBRA-TABS) 100 MG tablet Take 1 tablet (100 mg total) by mouth 2 (two) times daily. 20 tablet 0  . EUTHYROX 75 MCG  tablet Take 1 tablet by mouth once daily 30 tablet 0  . fluticasone (FLONASE) 50 MCG/ACT nasal spray Place 2 sprays into both nostrils daily. 16 g 6  . losartan (COZAAR) 100 MG tablet Take 1 tablet by mouth once daily 90 tablet 3  . memantine (NAMENDA) 10 MG tablet Take 1 tablet by mouth twice daily 90 tablet 0  . metFORMIN (GLUCOPHAGE) 500 MG tablet TAKE 1 TABLET BY MOUTH TWICE DAILY WITH MEALS 180 tablet 1  . oxyCODONE-acetaminophen (PERCOCET) 5-325 MG tablet Take 1 tablet by mouth every 4 (four) hours as needed for severe pain. 30 tablet 0  . simvastatin (ZOCOR) 20 MG tablet TAKE 1 TABLET BY MOUTH AT BEDTIME 90 tablet 1  . sulfamethoxazole-trimethoprim (BACTRIM DS) 800-160 MG tablet Take 1 tablet by mouth 2 (two) times daily. 14 tablet 0  . SYRINGE-NEEDLE, DISP, 3 ML (LUER LOCK SAFETY SYRINGES) 25G X 1" 3 ML MISC Use with B12 injections 50 each 1  . tiotropium (SPIRIVA HANDIHALER) 18 MCG inhalation capsule USE 1 INHALATION ONCE DAILY AS INSTRUCTED 30 capsule 3   No current facility-administered medications on file prior to visit.   Allergies  Allergen Reactions  . Prednisone     Cramps.   Social History   Socioeconomic History  . Marital status: Widowed    Spouse name: Not on file  . Number of children: Not on file  . Years of education: Not on file  . Highest education level: Not on file  Occupational History  . Not on file  Tobacco Use  . Smoking status: Current Every Day Smoker  . Smokeless tobacco: Never Used  Substance and Sexual Activity  . Alcohol use: Not on file  . Drug use: Not on file  . Sexual activity: Not on file  Other Topics Concern  . Not on file  Social History Narrative  . Not on file   Social Determinants of Health   Financial Resource Strain:   . Difficulty of Paying Living Expenses: Not on file  Food Insecurity:   . Worried About Programme researcher, broadcasting/film/video in the Last Year: Not on file  . Ran Out of Food in the  Last Year: Not on file  Transportation  Needs:   . Lack of Transportation (Medical): Not on file  . Lack of Transportation (Non-Medical): Not on file  Physical Activity:   . Days of Exercise per Week: Not on file  . Minutes of Exercise per Session: Not on file  Stress:   . Feeling of Stress : Not on file  Social Connections:   . Frequency of Communication with Friends and Family: Not on file  . Frequency of Social Gatherings with Friends and Family: Not on file  . Attends Religious Services: Not on file  . Active Member of Clubs or Organizations: Not on file  . Attends Banker Meetings: Not on file  . Marital Status: Not on file  Intimate Partner Violence:   . Fear of Current or Ex-Partner: Not on file  . Emotionally Abused: Not on file  . Physically Abused: Not on file  . Sexually Abused: Not on file     Review of Systems  All other systems reviewed and are negative.      Objective:   Physical Exam Vitals reviewed.  Constitutional:      General: She is not in acute distress.    Appearance: She is not ill-appearing or toxic-appearing.  HENT:     Right Ear: Tympanic membrane, ear canal and external ear normal.     Left Ear: Tympanic membrane, ear canal and external ear normal.     Nose: Nose normal. No congestion or rhinorrhea.     Mouth/Throat:     Pharynx: No oropharyngeal exudate.  Eyes:     Conjunctiva/sclera: Conjunctivae normal.  Neck:     Vascular: No carotid bruit.  Cardiovascular:     Rate and Rhythm: Normal rate and regular rhythm.     Pulses: Normal pulses.     Heart sounds: Normal heart sounds. No murmur heard.  No friction rub. No gallop.   Pulmonary:     Effort: Pulmonary effort is normal. No respiratory distress.     Breath sounds: Normal breath sounds. No wheezing, rhonchi or rales.  Abdominal:     General: Bowel sounds are normal. There is no distension.     Palpations: Abdomen is soft.     Tenderness: There is no abdominal tenderness. There is no guarding or rebound.   Musculoskeletal:     Cervical back: Neck supple.     Right lower leg: No edema.     Left lower leg: No edema.     Right ankle: No proximal fibula tenderness.  Lymphadenopathy:     Cervical: No cervical adenopathy.  Skin:    General: Skin is warm.     Findings: No erythema or rash.  Neurological:     General: No focal deficit present.     Mental Status: She is alert.     Cranial Nerves: No cranial nerve deficit.     Gait: Gait abnormal.  Psychiatric:        Attention and Perception: She is inattentive.        Mood and Affect: Affect is flat.        Speech: She is noncommunicative.        Behavior: Behavior is slowed.        Cognition and Memory: Cognition is impaired. Memory is impaired. She exhibits impaired recent memory and impaired remote memory.           Assessment & Plan:  Controlled type 2 diabetes mellitus without complication, without long-term current use  of insulin (HCC) - Plan: Hemoglobin A1c, CBC with Differential/Platelet, COMPLETE METABOLIC PANEL WITH GFR, Lipid panel  Essential hypertension  Hypothyroidism, unspecified type - Plan: TSH  Memory loss  Panlobular emphysema (HCC)  Dysuria - Plan: Urinalysis, Routine w reflex microscopic  Strongly recommended the Covid vaccine.  Check CBC, CMP, lipid panel, and hemoglobin A1c.  Goal hemoglobin A1c is less than 7.  Goal LDL cholesterol is less than 100.  Blood pressure today is acceptable.  Check TSH to ensure adequate dosage of levothyroxine.  Dementia has progressed to the point now that the patient is virtually noncommunicative.  Emphysema also appears to be progressing as the patient appears short of breath with minimal activity per her family's report.  Therefore we will switch her Spiriva to Trelegy 1 inhalation a day to see if this will be more beneficial to her.  Check a urinalysis.  Family denies any falls.  I am unable to perform a depression screen.  Patient has end-stage dementia.  The remainder of her  preventative care is up-to-date based on her age I would not recommend any cancer screening at this point

## 2020-08-23 LAB — COMPLETE METABOLIC PANEL WITH GFR
AG Ratio: 1.4 (calc) (ref 1.0–2.5)
ALT: 3 U/L — ABNORMAL LOW (ref 6–29)
AST: 13 U/L (ref 10–35)
Albumin: 3.9 g/dL (ref 3.6–5.1)
Alkaline phosphatase (APISO): 57 U/L (ref 37–153)
BUN/Creatinine Ratio: 15 (calc) (ref 6–22)
BUN: 20 mg/dL (ref 7–25)
CO2: 22 mmol/L (ref 20–32)
Calcium: 8.4 mg/dL — ABNORMAL LOW (ref 8.6–10.4)
Chloride: 106 mmol/L (ref 98–110)
Creat: 1.33 mg/dL — ABNORMAL HIGH (ref 0.60–0.88)
GFR, Est African American: 41 mL/min/{1.73_m2} — ABNORMAL LOW (ref >=60)
GFR, Est Non African American: 36 mL/min/{1.73_m2} — ABNORMAL LOW (ref >=60)
Globulin: 2.8 g/dL (calc) (ref 1.9–3.7)
Glucose, Bld: 86 mg/dL (ref 65–99)
Potassium: 4.1 mmol/L (ref 3.5–5.3)
Sodium: 143 mmol/L (ref 135–146)
Total Bilirubin: 0.3 mg/dL (ref 0.2–1.2)
Total Protein: 6.7 g/dL (ref 6.1–8.1)

## 2020-08-23 LAB — CBC WITH DIFFERENTIAL/PLATELET
Absolute Monocytes: 468 cells/uL (ref 200–950)
Basophils Absolute: 62 cells/uL (ref 0–200)
Basophils Relative: 0.8 %
Eosinophils Absolute: 343 cells/uL (ref 15–500)
Eosinophils Relative: 4.4 %
HCT: 34.4 % — ABNORMAL LOW (ref 35.0–45.0)
Hemoglobin: 10.9 g/dL — ABNORMAL LOW (ref 11.7–15.5)
Lymphs Abs: 2519 cells/uL (ref 850–3900)
MCH: 30.6 pg (ref 27.0–33.0)
MCHC: 31.7 g/dL — ABNORMAL LOW (ref 32.0–36.0)
MCV: 96.6 fL (ref 80.0–100.0)
MPV: 10.3 fL (ref 7.5–12.5)
Monocytes Relative: 6 %
Neutro Abs: 4407 cells/uL (ref 1500–7800)
Neutrophils Relative %: 56.5 %
Platelets: 245 10*3/uL (ref 140–400)
RBC: 3.56 10*6/uL — ABNORMAL LOW (ref 3.80–5.10)
RDW: 12 % (ref 11.0–15.0)
Total Lymphocyte: 32.3 %
WBC: 7.8 10*3/uL (ref 3.8–10.8)

## 2020-08-23 LAB — HEMOGLOBIN A1C
Hgb A1c MFr Bld: 5.4 % of total Hgb (ref <5.7)
Mean Plasma Glucose: 108 (calc)
eAG (mmol/L): 6 (calc)

## 2020-08-23 LAB — LIPID PANEL
Cholesterol: 143 mg/dL (ref <200)
HDL: 69 mg/dL (ref >=50)
LDL Cholesterol (Calc): 58 mg/dL (calc)
Non-HDL Cholesterol (Calc): 74 mg/dL (calc) (ref <130)
Total CHOL/HDL Ratio: 2.1 (calc) (ref <5.0)
Triglycerides: 75 mg/dL (ref <150)

## 2020-08-23 LAB — TSH: TSH: 0.18 mIU/L — ABNORMAL LOW (ref 0.40–4.50)

## 2020-08-27 ENCOUNTER — Other Ambulatory Visit: Payer: Self-pay

## 2020-08-27 ENCOUNTER — Other Ambulatory Visit: Payer: Medicare Other

## 2020-08-27 DIAGNOSIS — R300 Dysuria: Secondary | ICD-10-CM | POA: Diagnosis not present

## 2020-08-27 LAB — URINALYSIS, ROUTINE W REFLEX MICROSCOPIC
Bilirubin Urine: NEGATIVE
Glucose, UA: NEGATIVE
Hgb urine dipstick: NEGATIVE
Hyaline Cast: NONE SEEN /LPF
Ketones, ur: NEGATIVE
Leukocytes,Ua: NEGATIVE
Nitrite: POSITIVE — AB
Protein, ur: NEGATIVE
RBC / HPF: NONE SEEN /HPF (ref 0–2)
Specific Gravity, Urine: 1.015 (ref 1.001–1.03)
pH: 6 (ref 5.0–8.0)

## 2020-08-27 LAB — MICROSCOPIC MESSAGE

## 2020-08-27 MED ORDER — LEVOTHYROXINE SODIUM 50 MCG PO TABS: 50.0000 ug | ORAL_TABLET | Freq: Every day | ORAL | 0 refills | Status: DC

## 2020-08-28 ENCOUNTER — Other Ambulatory Visit: Payer: Self-pay

## 2020-08-28 ENCOUNTER — Other Ambulatory Visit: Payer: Self-pay | Admitting: Family Medicine

## 2020-08-28 MED ORDER — FLUCONAZOLE 150 MG PO TABS: 150.0000 mg | ORAL_TABLET | Freq: Once | ORAL | 0 refills | Status: AC

## 2020-08-28 MED ORDER — SULFAMETHOXAZOLE-TRIMETHOPRIM 800-160 MG PO TABS
1.0000 | ORAL_TABLET | Freq: Two times a day (BID) | ORAL | 0 refills | Status: DC
Start: 2020-08-28 — End: 2021-03-30

## 2020-10-06 ENCOUNTER — Other Ambulatory Visit: Payer: Self-pay | Admitting: Family Medicine

## 2020-11-28 ENCOUNTER — Other Ambulatory Visit: Payer: Self-pay | Admitting: Family Medicine

## 2020-12-06 ENCOUNTER — Other Ambulatory Visit: Payer: Self-pay | Admitting: Family Medicine

## 2021-01-11 ENCOUNTER — Other Ambulatory Visit: Payer: Self-pay | Admitting: Family Medicine

## 2021-02-25 ENCOUNTER — Telehealth: Payer: Self-pay | Admitting: Family Medicine

## 2021-02-25 NOTE — Telephone Encounter (Signed)
Please advise 

## 2021-02-25 NOTE — Telephone Encounter (Signed)
Taylor Sawyer did a medication outreach to daughter Taylor Sawyer, to do a medication reconciliation since she showed up on the failing list for 2021 for Metformin. Taylor Sawyer states that when she was talking with daughter she said that the patient wasn't taken Metformin. If the patient is no longer taking the medication please remove it from her med list. Taylor Sawyer would also like a phone call back.   CB# [phone]

## 2021-02-26 ENCOUNTER — Other Ambulatory Visit: Payer: Self-pay | Admitting: Family Medicine

## 2021-02-26 NOTE — Telephone Encounter (Signed)
I have left vm for [facility] stating that the patient was taken off of Metformin and that I will ask the nurse to discontinue the medication on her list. Patient is scheduled for labs.

## 2021-02-26 NOTE — Telephone Encounter (Signed)
Metformin has been stopped however she was supposed to recheck labs in 6 weeks from last ov 8/21.  Overdue to recheck tsh.

## 2021-03-02 ENCOUNTER — Other Ambulatory Visit: Payer: Medicare Other

## 2021-03-02 ENCOUNTER — Other Ambulatory Visit: Payer: Self-pay | Admitting: Family Medicine

## 2021-03-24 ENCOUNTER — Other Ambulatory Visit: Payer: Medicare Other

## 2021-03-24 ENCOUNTER — Ambulatory Visit: Payer: Medicare Other | Admitting: Family Medicine

## 2021-03-30 ENCOUNTER — Ambulatory Visit (INDEPENDENT_AMBULATORY_CARE_PROVIDER_SITE_OTHER): Payer: Medicare Other | Admitting: Family Medicine

## 2021-03-30 ENCOUNTER — Other Ambulatory Visit: Payer: Self-pay

## 2021-03-30 ENCOUNTER — Encounter: Payer: Self-pay | Admitting: Family Medicine

## 2021-03-30 VITALS — BP 138/84 | HR 58 | Temp 98.0°F | Resp 16 | Ht 62.0 in | Wt 129.0 lb

## 2021-03-30 DIAGNOSIS — E039 Hypothyroidism, unspecified: Secondary | ICD-10-CM | POA: Diagnosis not present

## 2021-03-30 DIAGNOSIS — I10 Essential (primary) hypertension: Secondary | ICD-10-CM

## 2021-03-30 DIAGNOSIS — E119 Type 2 diabetes mellitus without complications: Secondary | ICD-10-CM

## 2021-03-30 DIAGNOSIS — M21612 Bunion of left foot: Secondary | ICD-10-CM

## 2021-03-30 MED ORDER — FLUTICASONE PROPIONATE 50 MCG/ACT NA SUSP: 2.0000 | Freq: Every day | NASAL | 6 refills | Status: AC

## 2021-03-30 NOTE — Progress Notes (Signed)
Subjective:    Patient ID: Taylor Sawyer, female    DOB: 1930/08/24, 87 y.o.   MRN: [id number]    Patient has a bunion due to hallux valgus deformity of her left great toe.  There is a hyperkeratotic papule that is painful and forming on the side.  This is roughly 5 mm in diameter.  It prevents the family from applying a bunion pad.  She is also due for fasting lab work to monitor her diabetes cholesterol and thyroid issues.  She has advanced dementia.  Due to her advanced age, she continues to lose weight.  The family wants to focus on comfort.  She would be a poor surgical candidate. Past Medical History:  Diagnosis Date  . Asthma   . B12 deficiency   . COPD (chronic obstructive pulmonary disease) (HCC)   . Diabetes mellitus   . Hyperlipidemia   . Hypertension   . Hypothyroidism   . Memory loss   . Tobacco user     Current Outpatient Medications on File Prior to Visit  Medication Sig Dispense Refill  . albuterol (PROVENTIL HFA;VENTOLIN HFA) 108 (90 Base) MCG/ACT inhaler Inhale 2 puffs into the lungs every 6 (six) hours as needed for wheezing or shortness of breath. 1 Inhaler 0  . cyanocobalamin (,VITAMIN B-12,) 1000 MCG/ML injection Inject  1 ml IM monthly 10 mL 2  . fluticasone (FLONASE) 50 MCG/ACT nasal spray Place 2 sprays into both nostrils daily. 16 g 6  . Fluticasone-Umeclidin-Vilant (TRELEGY ELLIPTA) 100-62.5-25 MCG/INH AEPB Inhale 1 Inhaler into the lungs daily. 1 each 11  . levothyroxine (SYNTHROID) 50 MCG tablet TAKE 1 TABLET BY MOUTH ONCE DAILY BEFORE BREAKFAST 90 tablet 0  . losartan (COZAAR) 100 MG tablet Take 1 tablet by mouth once daily 90 tablet 3  . memantine (NAMENDA) 10 MG tablet Take 1 tablet by mouth twice daily 90 tablet 0  . simvastatin (ZOCOR) 20 MG tablet TAKE 1 TABLET BY MOUTH AT BEDTIME 90 tablet 0  . SYRINGE-NEEDLE, DISP, 3 ML (LUER LOCK SAFETY SYRINGES) 25G X 1" 3 ML MISC Use with B12 injections 50 each 1   No current facility-administered medications  on file prior to visit.   Allergies  Allergen Reactions  . Prednisone     Cramps.   Social History   Socioeconomic History  . Marital status: Widowed    Spouse name: Not on file  . Number of children: Not on file  . Years of education: Not on file  . Highest education level: Not on file  Occupational History  . Not on file  Tobacco Use  . Smoking status: Current Every Day Smoker  . Smokeless tobacco: Never Used  Substance and Sexual Activity  . Alcohol use: Not on file  . Drug use: Not on file  . Sexual activity: Not on file  Other Topics Concern  . Not on file  Social History Narrative  . Not on file   Social Determinants of Health   Financial Resource Strain: Not on file  Food Insecurity: Not on file  Transportation Needs: Not on file  Physical Activity: Not on file  Stress: Not on file  Social Connections: Not on file  Intimate Partner Violence: Not on file     Review of Systems  All other systems reviewed and are negative.      Objective:   Physical Exam Vitals reviewed.  Constitutional:      General: She is not in acute distress.    Appearance:  She is not ill-appearing or toxic-appearing.  HENT:     Right Ear: Tympanic membrane, ear canal and external ear normal.     Left Ear: Tympanic membrane, ear canal and external ear normal.     Nose: Nose normal. No congestion or rhinorrhea.     Mouth/Throat:     Pharynx: No oropharyngeal exudate.  Eyes:     Conjunctiva/sclera: Conjunctivae normal.  Neck:     Vascular: No carotid bruit.  Cardiovascular:     Rate and Rhythm: Normal rate and regular rhythm.     Pulses: Normal pulses.     Heart sounds: Normal heart sounds. No murmur heard. No friction rub. No gallop.   Pulmonary:     Effort: Pulmonary effort is normal. No respiratory distress.     Breath sounds: Normal breath sounds. No wheezing, rhonchi or rales.  Abdominal:     General: Bowel sounds are normal. There is no distension.     Palpations:  Abdomen is soft.     Tenderness: There is no abdominal tenderness. There is no guarding or rebound.  Musculoskeletal:     Cervical back: Neck supple.     Right lower leg: No edema.     Left lower leg: No edema.     Right ankle: No proximal fibula tenderness.  Lymphadenopathy:     Cervical: No cervical adenopathy.  Skin:    General: Skin is warm.     Findings: No erythema or rash.  Neurological:     General: No focal deficit present.     Mental Status: She is alert.     Cranial Nerves: No cranial nerve deficit.     Gait: Gait abnormal.  Psychiatric:        Attention and Perception: She is inattentive.        Mood and Affect: Affect is flat.        Speech: She is noncommunicative.        Behavior: Behavior is slowed.        Cognition and Memory: Cognition is impaired. Memory is impaired. She exhibits impaired recent memory and impaired remote memory.           Assessment & Plan:  Controlled type 2 diabetes mellitus without complication, without long-term current use of insulin (HCC) - Plan: CBC with Differential/Platelet, COMPLETE METABOLIC PANEL WITH GFR, Lipid panel, Hemoglobin A1c  Essential hypertension  Hypothyroidism, unspecified type - Plan: TSH  Bunion of great toe of left foot  I pared down the hyperkeratotic papule using a razor blade even with the surrounding skin.  I recommended that they apply bunion pad to that area to prevent recurrence.  If it does start to recur, they could apply Compound W to slowly remove or debride the lesion.  While the patient is here today I will check a CBC CMP lipid panel A1c and TSH.  Blood pressure is excellent at 138/84.

## 2021-03-31 ENCOUNTER — Encounter: Payer: Self-pay | Admitting: *Deleted

## 2021-03-31 LAB — COMPLETE METABOLIC PANEL WITH GFR
AG Ratio: 1.3 (calc) (ref 1.0–2.5)
ALT: 5 U/L — ABNORMAL LOW (ref 6–29)
AST: 16 U/L (ref 10–35)
Albumin: 4 g/dL (ref 3.6–5.1)
Alkaline phosphatase (APISO): 71 U/L (ref 37–153)
BUN/Creatinine Ratio: 19 (calc) (ref 6–22)
BUN: 28 mg/dL — ABNORMAL HIGH (ref 7–25)
CO2: 20 mmol/L (ref 20–32)
Calcium: 9 mg/dL (ref 8.6–10.4)
Chloride: 106 mmol/L (ref 98–110)
Creat: 1.45 mg/dL — ABNORMAL HIGH (ref 0.60–0.88)
GFR, Est African American: 37 mL/min/{1.73_m2} — ABNORMAL LOW (ref >=60)
GFR, Est Non African American: 32 mL/min/{1.73_m2} — ABNORMAL LOW (ref >=60)
Globulin: 3.1 g/dL (calc) (ref 1.9–3.7)
Glucose, Bld: 104 mg/dL — ABNORMAL HIGH (ref 65–99)
Potassium: 4.2 mmol/L (ref 3.5–5.3)
Sodium: 141 mmol/L (ref 135–146)
Total Bilirubin: 0.4 mg/dL (ref 0.2–1.2)
Total Protein: 7.1 g/dL (ref 6.1–8.1)

## 2021-03-31 LAB — CBC WITH DIFFERENTIAL/PLATELET
Absolute Monocytes: 370 cells/uL (ref 200–950)
Basophils Absolute: 53 cells/uL (ref 0–200)
Basophils Relative: 0.8 %
Eosinophils Absolute: 224 cells/uL (ref 15–500)
Eosinophils Relative: 3.4 %
HCT: 38.4 % (ref 35.0–45.0)
Hemoglobin: 12.3 g/dL (ref 11.7–15.5)
Lymphs Abs: 2297 cells/uL (ref 850–3900)
MCH: 30.9 pg (ref 27.0–33.0)
MCHC: 32 g/dL (ref 32.0–36.0)
MCV: 96.5 fL (ref 80.0–100.0)
MPV: 10.3 fL (ref 7.5–12.5)
Monocytes Relative: 5.6 %
Neutro Abs: 3656 cells/uL (ref 1500–7800)
Neutrophils Relative %: 55.4 %
Platelets: 201 10*3/uL (ref 140–400)
RBC: 3.98 10*6/uL (ref 3.80–5.10)
RDW: 12.5 % (ref 11.0–15.0)
Total Lymphocyte: 34.8 %
WBC: 6.6 10*3/uL (ref 3.8–10.8)

## 2021-03-31 LAB — LIPID PANEL
Cholesterol: 153 mg/dL (ref <200)
HDL: 68 mg/dL (ref >=50)
LDL Cholesterol (Calc): 67 mg/dL (calc)
Non-HDL Cholesterol (Calc): 85 mg/dL (calc) (ref <130)
Total CHOL/HDL Ratio: 2.3 (calc) (ref <5.0)
Triglycerides: 101 mg/dL (ref <150)

## 2021-03-31 LAB — TSH: TSH: 4 mIU/L (ref 0.40–4.50)

## 2021-03-31 LAB — HEMOGLOBIN A1C
Hgb A1c MFr Bld: 6.1 % of total Hgb — ABNORMAL HIGH (ref <5.7)
Mean Plasma Glucose: 128 mg/dL
eAG (mmol/L): 7.1 mmol/L

## 2021-04-18 ENCOUNTER — Other Ambulatory Visit: Payer: Self-pay | Admitting: Family Medicine

## 2021-04-20 ENCOUNTER — Telehealth: Payer: Self-pay | Admitting: *Deleted

## 2021-04-20 ENCOUNTER — Other Ambulatory Visit: Payer: Self-pay | Admitting: Family Medicine

## 2021-04-20 MED ORDER — CEPHALEXIN 500 MG PO CAPS: 500.0000 mg | ORAL_CAPSULE | Freq: Three times a day (TID) | ORAL | 0 refills | Status: DC

## 2021-04-20 NOTE — Telephone Encounter (Signed)
Received call from patient caregiver, Rinaldo Cloud [phone] telephone.  Reports that patient has dark colored urine with strong smell. States that patient voices complaints of burning.   Requested prescription for ABTx. Please advise.

## 2021-04-20 NOTE — Telephone Encounter (Signed)
Call placed to patient and patient daughter Rinaldo Cloud made aware.

## 2021-04-20 NOTE — Telephone Encounter (Signed)
I sent keflex to [facility] in [place].

## 2021-06-12 ENCOUNTER — Other Ambulatory Visit: Payer: Self-pay | Admitting: Family Medicine

## 2021-09-03 ENCOUNTER — Other Ambulatory Visit: Payer: Self-pay | Admitting: Family Medicine

## 2021-09-15 ENCOUNTER — Ambulatory Visit: Payer: Medicare Other | Admitting: Family Medicine

## 2021-09-17 ENCOUNTER — Other Ambulatory Visit: Payer: Self-pay

## 2021-09-17 ENCOUNTER — Encounter: Payer: Self-pay | Admitting: Family Medicine

## 2021-09-17 ENCOUNTER — Ambulatory Visit (INDEPENDENT_AMBULATORY_CARE_PROVIDER_SITE_OTHER): Payer: Medicare Other | Admitting: Family Medicine

## 2021-09-17 VITALS — BP 136/88 | HR 84 | Temp 98.1°F | Resp 14

## 2021-09-17 DIAGNOSIS — R0989 Other specified symptoms and signs involving the circulatory and respiratory systems: Secondary | ICD-10-CM

## 2021-09-17 DIAGNOSIS — R300 Dysuria: Secondary | ICD-10-CM | POA: Diagnosis not present

## 2021-09-17 MED ORDER — LEVOFLOXACIN 500 MG PO TABS: 500.0000 mg | ORAL_TABLET | Freq: Every day | ORAL | 0 refills | Status: AC

## 2021-09-17 NOTE — Progress Notes (Signed)
Subjective:    Patient ID: Taylor Sawyer, female    DOB: [birth year], 87 y.o.   MRN: [id number]  Patient is a very frail 87 year old Caucasian female with a history of COPD who continues to smoke as well as dementia.  She presents today with her daughter due to dysuria.  Her daughter states that 3 days ago she was complaining of dysuria and urgency.  She states that her mom is no longer complaining of those symptoms now however the history is suspect given her dementia.  Today she denies any abdominal pain, dysuria, urgency, frequency, hematuria.  She has no CVA tenderness on exam however she does have pronounced left basilar crackles posteriorly that I have not appreciated before.  Daughter denies any cough or fever or chills  Past Medical History:  Diagnosis Date   Asthma    B12 deficiency    COPD (chronic obstructive pulmonary disease) (HCC)    Diabetes mellitus    Hyperlipidemia    Hypertension    Hypothyroidism    Memory loss    Tobacco user     Current Outpatient Medications on File Prior to Visit  Medication Sig Dispense Refill   albuterol (PROVENTIL HFA;VENTOLIN HFA) 108 (90 Base) MCG/ACT inhaler Inhale 2 puffs into the lungs every 6 (six) hours as needed for wheezing or shortness of breath. 1 Inhaler 0   cyanocobalamin (,VITAMIN B-12,) 1000 MCG/ML injection Inject  1 ml IM monthly 10 mL 2   EUTHYROX 50 MCG tablet TAKE 1 TABLET BY MOUTH ONCE DAILY BEFORE BREAKFAST 90 tablet 0   fluticasone (FLONASE) 50 MCG/ACT nasal spray Place 2 sprays into both nostrils daily. 16 g 6   fluticasone (FLONASE) 50 MCG/ACT nasal spray Place 2 sprays into both nostrils daily. 16 g 6   Fluticasone-Umeclidin-Vilant (TRELEGY ELLIPTA) 100-62.5-25 MCG/INH AEPB Inhale 1 Inhaler into the lungs daily. 1 each 11   losartan (COZAAR) 100 MG tablet Take 1 tablet by mouth once daily 90 tablet 3   memantine (NAMENDA) 10 MG tablet Take 1 tablet by mouth twice daily 90 tablet 0   simvastatin (ZOCOR) 20 MG tablet  TAKE 1 TABLET BY MOUTH AT BEDTIME 90 tablet 2   SYRINGE-NEEDLE, DISP, 3 ML (LUER LOCK SAFETY SYRINGES) 25G X 1" 3 ML MISC Use with B12 injections 50 each 1   No current facility-administered medications on file prior to visit.   Allergies  Allergen Reactions   Prednisone     Cramps.   Social History   Socioeconomic History   Marital status: Widowed    Spouse name: Not on file   Number of children: Not on file   Years of education: Not on file   Highest education level: Not on file  Occupational History   Not on file  Tobacco Use   Smoking status: Every Day   Smokeless tobacco: Never  Substance and Sexual Activity   Alcohol use: Not on file   Drug use: Not on file   Sexual activity: Not on file  Other Topics Concern   Not on file  Social History Narrative   Not on file   Social Determinants of Health   Financial Resource Strain: Not on file  Food Insecurity: Not on file  Transportation Needs: Not on file  Physical Activity: Not on file  Stress: Not on file  Social Connections: Not on file  Intimate Partner Violence: Not on file     Review of Systems  All other systems reviewed and are negative.  Objective:   Physical Exam Vitals reviewed.  Constitutional:      General: She is not in acute distress.    Appearance: She is not ill-appearing or toxic-appearing.  HENT:     Left Ear: External ear normal.     Nose: Nose normal. No congestion or rhinorrhea.     Mouth/Throat:     Pharynx: No oropharyngeal exudate.  Eyes:     Conjunctiva/sclera: Conjunctivae normal.  Neck:     Vascular: No carotid bruit.  Cardiovascular:     Rate and Rhythm: Normal rate and regular rhythm.     Pulses: Normal pulses.     Heart sounds: Normal heart sounds. No murmur heard.   No friction rub. No gallop.  Pulmonary:     Effort: Pulmonary effort is normal. No respiratory distress.     Breath sounds: Rales present. No wheezing or rhonchi.    Abdominal:     General: Bowel  sounds are normal. There is no distension.     Palpations: Abdomen is soft.     Tenderness: There is no abdominal tenderness. There is no guarding or rebound.  Musculoskeletal:     Cervical back: Neck supple.     Right lower leg: No edema.     Left lower leg: No edema.     Right ankle: No proximal fibula tenderness.  Lymphadenopathy:     Cervical: No cervical adenopathy.  Skin:    General: Skin is warm.     Findings: No erythema or rash.  Neurological:     General: No focal deficit present.     Mental Status: She is alert.     Cranial Nerves: No cranial nerve deficit.     Gait: Gait abnormal.  Psychiatric:        Attention and Perception: She is inattentive.        Mood and Affect: Affect is flat.        Speech: She is noncommunicative.        Behavior: Behavior is slowed.        Cognition and Memory: Cognition is impaired. Memory is impaired. She exhibits impaired recent memory and impaired remote memory.          Assessment & Plan:  Dysuria - Plan: Urinalysis, Routine w reflex microscopic, Urine Culture  Abnormal lung sounds - Plan: DG Chest 2 View Patient was unable to give [name] a urine sample today.  She was incontinent and missed the sample collection device.  Therefore we are not going be able to get a sample today.  We are going into the weekend and I am concerned that if she has a bladder infection and we do not treat it she would likely wind up in the hospital over the weekend.  I am also concerned by the pronounced left basilar crackles that are markedly asymmetric compared to the right side.  I recommended starting Levaquin 500 mg daily for possible UTI as well as perhaps community-acquired pneumonia and proceeding to get a chest x-ray as soon as possible

## 2021-10-22 ENCOUNTER — Other Ambulatory Visit: Payer: Self-pay | Admitting: Family Medicine

## 2021-10-23 ENCOUNTER — Ambulatory Visit
Admission: RE | Admit: 2021-10-23 | Discharge: 2021-10-23 | Disposition: A | Discharge status: Home / Self Care | Payer: Medicare Other | Source: Ambulatory Visit | Attending: Family Medicine | Admitting: Family Medicine

## 2021-10-23 DIAGNOSIS — R0989 Other specified symptoms and signs involving the circulatory and respiratory systems: Secondary | ICD-10-CM

## 2021-10-23 DIAGNOSIS — J439 Emphysema, unspecified: Secondary | ICD-10-CM | POA: Diagnosis not present

## 2021-12-11 ENCOUNTER — Other Ambulatory Visit: Payer: Self-pay | Admitting: Family Medicine

## 2021-12-24 ENCOUNTER — Other Ambulatory Visit: Payer: Self-pay

## 2021-12-24 ENCOUNTER — Other Ambulatory Visit: Payer: Self-pay | Admitting: Family Medicine

## 2021-12-24 DIAGNOSIS — E039 Hypothyroidism, unspecified: Secondary | ICD-10-CM

## 2021-12-24 MED ORDER — LEVOTHYROXINE SODIUM 50 MCG PO TABS: 50.0000 ug | ORAL_TABLET | Freq: Every day | ORAL | 0 refills | Status: DC

## 2022-01-01 ENCOUNTER — Telehealth: Payer: Self-pay

## 2022-01-01 ENCOUNTER — Ambulatory Visit (INDEPENDENT_AMBULATORY_CARE_PROVIDER_SITE_OTHER): Payer: Medicare Other

## 2022-01-01 ENCOUNTER — Other Ambulatory Visit: Payer: Self-pay

## 2022-01-01 VITALS — Ht 62.0 in | Wt 129.0 lb

## 2022-01-01 DIAGNOSIS — Z0000 Encounter for general adult medical examination without abnormal findings: Secondary | ICD-10-CM

## 2022-01-01 NOTE — Telephone Encounter (Signed)
In reviewing pt's medications, daughter, Rinaldo Cloud asks if pt needs to continue the Vit B12 injections? Pt is out of medication and will need a refill if the medication is needed. Thank you!

## 2022-01-01 NOTE — Progress Notes (Addendum)
Subjective:   Taylor Sawyer is a 88 y.o. female who presents for Medicare Annual (Subsequent) preventive examination. Virtual Visit via Telephone Note  I connected with  Taylor Sawyer on 01/01/22 at  2:45 PM EST by telephone and verified that I am speaking with the correct person using two identifiers.  Location: Patient: HOME Provider: [facility]. Persons participating in the virtual visit: patient/Nurse Health Advisor   I discussed the limitations, risks, security and privacy concerns of performing an evaluation and management service by telephone and the availability of in person appointments. The patient expressed understanding and agreed to proceed.  Interactive audio and video telecommunications were attempted between this nurse and patient, however failed, due to patient having technical difficulties OR patient did not have access to video capability.  We continued and completed visit with audio only.  Some vital signs may be absent or patient reported.   Taylor Driver, LPN  Review of Systems     Cardiac Risk Factors include: advanced age (>2men, >72 women);hypertension;dyslipidemia;sedentary lifestyle;Other (see comment), Risk factor comments: Dementia  PHONE VISIT. PT AT HOME. NURSE AT [facility].    Objective:    Today's Vitals   01/01/22 1453  Weight: 129 lb (58.5 kg)  Height: 5\' 2"  (1.575 m)   Body mass index is 23.59 kg/m.  Advanced Directives 01/01/2022 08/22/2020  Does Patient Have a Medical Advance Directive? No Yes  Type of Advance Directive - [place]  Would patient like information on creating a medical advance directive? No - Patient declined -    Current Medications (verified) Outpatient Encounter Medications as of 01/01/2022  Medication Sig   albuterol (PROVENTIL HFA;VENTOLIN HFA) 108 (90 Base) MCG/ACT inhaler Inhale 2 puffs into the lungs every 6 (six) hours as needed for wheezing or shortness of breath.   cyanocobalamin (,VITAMIN  B-12,) 1000 MCG/ML injection Inject  1 ml IM monthly   fluticasone (FLONASE) 50 MCG/ACT nasal spray Place 2 sprays into both nostrils daily.   fluticasone (FLONASE) 50 MCG/ACT nasal spray Place 2 sprays into both nostrils daily.   Fluticasone-Umeclidin-Vilant (TRELEGY ELLIPTA) 100-62.5-25 MCG/INH AEPB Inhale 1 Inhaler into the lungs daily.   levothyroxine (EUTHYROX) 50 MCG tablet Take 1 tablet (50 mcg total) by mouth daily before breakfast.   losartan (COZAAR) 100 MG tablet Take 1 tablet by mouth once daily   memantine (NAMENDA) 10 MG tablet Take 1 tablet by mouth twice daily   simvastatin (ZOCOR) 20 MG tablet TAKE 1 TABLET BY MOUTH AT BEDTIME   SYRINGE-NEEDLE, DISP, 3 ML (LUER LOCK SAFETY SYRINGES) 25G X 1" 3 ML MISC Use with B12 injections   No facility-administered encounter medications on file as of 01/01/2022.    Allergies (verified) Prednisone   History: Past Medical History:  Diagnosis Date   Asthma    B12 deficiency    COPD (chronic obstructive pulmonary disease) ([place])    Diabetes mellitus    Hyperlipidemia    Hypertension    Hypothyroidism    Memory loss    Tobacco user    Past Surgical History:  Procedure Laterality Date   THYROIDECTOMY     History reviewed. No pertinent family history. Social History   Socioeconomic History   Marital status: Widowed    Spouse name: Not on file   Number of children: Not on file   Years of education: Not on file   Highest education level: Not on file  Occupational History   Not on file  Tobacco Use  Smoking status: Every Day   Smokeless tobacco: Never  Substance and Sexual Activity   Alcohol use: Never   Drug use: Never   Sexual activity: Not Currently  Other Topics Concern   Not on file  Social History Narrative   Pt lived with daughter Taylor Sawyer and son in [place].    Social Determinants of Health   Financial Resource Strain: Low Risk    Difficulty of Paying Living Expenses: Not hard at all  Food Insecurity: No Food  Insecurity   Worried About Charity fundraiser in the Last Year: Never true   [place] in the Last Year: Never true  Transportation Needs: No Transportation Needs   Lack of Transportation (Medical): No   Lack of Transportation (Non-Medical): No  Physical Activity: Inactive   Days of Exercise per Week: 0 days   Minutes of Exercise per Session: 0 min  Stress: No Stress Concern Present   Feeling of Stress : Not at all  Social Connections: Socially Isolated   Frequency of Communication with Friends and Family: More than three times a week   Frequency of Social Gatherings with Friends and Family: More than three times a week   Attends Religious Services: Never   Marine scientist or Organizations: No   Attends Archivist Meetings: Never   Marital Status: Widowed    Tobacco Counseling Ready to quit: Not Answered Counseling given: Not Answered   Clinical Intake:  Pre-visit preparation completed: Yes  Pain : No/denies pain     BMI - recorded: 23.59 Nutritional Status: BMI of 19-24  Normal Nutritional Risks: None Diabetes: No  How often do you need to have someone help you when you read instructions, pamphlets, or other written materials from your doctor or pharmacy?: 5 - Always  Diabetic?NO  Interpreter Needed?: No  Information entered by :: MJ Hedi Barkan,LPN   Activities of Daily Living In your present state of health, do you have any difficulty performing the following activities: 01/01/2022  Hearing? Y  Vision? Y  Difficulty concentrating or making decisions? Y  Walking or climbing stairs? Y  Dressing or bathing? Y  Doing errands, shopping? Y  Preparing Food and eating ? Y  Using the Toilet? Y  In the past six months, have you accidently leaked urine? Y  Do you have problems with loss of bowel control? Y  Managing your Medications? Y  Managing your Finances? Y  Housekeeping or managing your Housekeeping? Y  Some recent data might be hidden     Patient Care Team: Taylor Frizzle, MD as PCP - General (Family Medicine)  Indicate any recent Medical Services you may have received from other than [facility] providers in the past year (date may be approximate).     Assessment:   This is a routine wellness examination for Taylor Sawyer.  Hearing/Vision screen Hearing Screening - Comments:: Hard of hearing. Will not get hearing aids.  Vision Screening - Comments:: Glasses. Overdue for eye exam. Unable to do eye exam due to dementia.  Dietary issues and exercise activities discussed: Current Exercise Habits: The patient does not participate in regular exercise at present, Exercise limited by: cardiac condition(s);neurologic condition(s)   Goals Addressed             This Visit's Progress    Have 3 meals a day       Continue to maintain health.       Depression Screen PHQ 2/9 Scores 01/01/2022 09/17/2021 08/22/2020 10/13/2017  PHQ - 2 Score 0 0 3 0  PHQ- 9 Score - - 9 11    Fall Risk Fall Risk  01/01/2022 09/17/2021 08/22/2020 07/06/2018 10/13/2017  Falls in the past year? 0 0 0 Yes No  Number falls in past yr: 0 0 0 2 or more -  Injury with Fall? 0 0 0 Yes -  Risk Factor Category  - - - High Fall Risk -  Risk for fall due to : Impaired balance/gait;Impaired mobility;Impaired vision Impaired balance/gait;Impaired mobility - History of fall(s);Mental status change -  Follow up Falls prevention discussed Falls evaluation completed - Falls evaluation completed -    FALL RISK PREVENTION PERTAINING TO THE HOME:  Any stairs in or around the home? No  If so, are there any without handrails? No  Home free of loose throw rugs in walkways, pet beds, electrical cords, etc? Yes  Adequate lighting in your home to reduce risk of falls? Yes   ASSISTIVE DEVICES UTILIZED TO PREVENT FALLS:  Life alert? No  Use of a cane, walker or w/c? Yes  Grab bars in the bathroom? Yes  Shower chair or bench in shower? Yes  Elevated toilet seat or a  handicapped toilet? Yes   TIMED UP AND GO:  Was the test performed? No .  Phone visit.  Cognitive Function: Patient has current diagnosis of cognitive impairment. 01/01/2022. Patient is unable to complete screening 6CIT or MMSE.  Pt has current dx of dementia.         Immunizations Immunization History  Administered Date(s) Administered   DTaP 06/27/2003   Influenza Whole 11/26/2004   Influenza,inj,Quad PF,6+ Mos 10/17/2014   PFIZER(Purple Top)SARS-COV-2 Vaccination 09/02/2020, 09/26/2020   Pneumococcal Conjugate-13 10/17/2014   Pneumococcal Polysaccharide-23 09/26/2002    TDAP status: Due, Education has been provided regarding the importance of this vaccine. Advised may receive this vaccine at local pharmacy or Health Dept. Aware to provide a copy of the vaccination record if obtained from local pharmacy or Health Dept. Verbalized acceptance and understanding.  Flu Vaccine status: Due, Education has been provided regarding the importance of this vaccine. Advised may receive this vaccine at local pharmacy or Health Dept. Aware to provide a copy of the vaccination record if obtained from local pharmacy or Health Dept. Verbalized acceptance and understanding.  Pneumococcal vaccine status: Up to date  Covid-19 vaccine status: Completed vaccines  Qualifies for Shingles Vaccine? Yes   Zostavax completed No   Shingrix Completed?: No.    Education has been provided regarding the importance of this vaccine. Patient has been advised to call insurance company to determine out of pocket expense if they have not yet received this vaccine. Advised may also receive vaccine at local pharmacy or Health Dept. Verbalized acceptance and understanding.  Screening Tests Health Maintenance  Topic Date Due   FOOT EXAM  Never done   OPHTHALMOLOGY EXAM  Never done   TETANUS/TDAP  Never done   Zoster Vaccines- Shingrix (1 of 2) Never done   DEXA SCAN  Never done   COVID-19 Vaccine (3 - Booster for  Pfizer series) 11/21/2020   INFLUENZA VACCINE  07/27/2021   HEMOGLOBIN A1C  09/29/2021   Pneumonia Vaccine 47+ Years old  Completed   HPV VACCINES  Aged Out    Health Maintenance  Health Maintenance Due  Topic Date Due   FOOT EXAM  Never done   OPHTHALMOLOGY EXAM  Never done   TETANUS/TDAP  Never done   Zoster Vaccines- Shingrix (1 of  2) Never done   DEXA SCAN  Never done   COVID-19 Vaccine (3 - Booster for Pfizer series) 11/21/2020   INFLUENZA VACCINE  07/27/2021   HEMOGLOBIN A1C  09/29/2021    Colorectal cancer screening: No longer required.   Mammogram status: No longer required due to age.  Bone Density status: Ordered No longer required due to age. Pt provided with contact info and advised to call to schedule appt.  Lung Cancer Screening: (Low Dose CT Chest recommended if Age 32-80 years, 30 pack-year currently smoking OR have quit w/in 15years.) does not qualify.    Additional Screening:  Hepatitis C Screening: does not qualify;  Vision Screening: Recommended annual ophthalmology exams for early detection of glaucoma and other disorders of the eye. Is the patient up to date with their annual eye exam?  No  Who is the provider or what is the name of the office in which the patient attends annual eye exams? N/A If pt is not established with a provider, would they like to be referred to a provider to establish care? No .   Dental Screening: Recommended annual dental exams for proper oral hygiene  Community Resource Referral / Chronic Care Management: CRR required this visit?  No   CCM required this visit?  No      Plan:     I have personally reviewed and noted the following in the patients chart:   Medical and social history Use of alcohol, tobacco or illicit drugs  Current medications and supplements including opioid prescriptions.  Functional ability and status Nutritional status Physical activity Advanced directives List of other  physicians Hospitalizations, surgeries, and ER visits in previous 12 months Vitals Screenings to include cognitive, depression, and falls Referrals and appointments  In addition, I have reviewed and discussed with patient certain preventive protocols, quality metrics, and best practice recommendations. A written personalized care plan for preventive services as well as general preventive health recommendations were provided to patient.     Taylor Driver, LPN   [id number]   Nurse Notes: Visit completed with pt's daughter, Taylor Sawyer, due to pt's dementia diagnosis. Discussed flu and shingles vaccines and how to obtain. Pt is unable to have eye exam due to pt's dementia dx and inability to cooperate with exam.

## 2022-01-01 NOTE — Patient Instructions (Signed)
Taylor Sawyer , Thank you for taking time to come for your Medicare Wellness Visit. I appreciate your ongoing commitment to your health goals. Please review the following plan we discussed and let me know if I can assist you in the future.   Screening recommendations/referrals: Colonoscopy: No longer required due to age. Mammogram: No longer required due to age. Bone Density: No longer required due to age.  Recommended yearly ophthalmology/optometry visit for glaucoma screening and checkup Recommended yearly dental visit for hygiene and checkup  Vaccinations: Influenza vaccine: Due Repeat annually  Pneumococcal vaccine: Due 09/26/2002 and 10/17/2014. Tdap vaccine: Due Repeat in 10 years  Shingles vaccine: Shingrix discussed. Please contact your pharmacy for coverage information.     Covid-19:Done 09/02/2020 and 09/26/2020.  Advanced directives: Advance directive discussed with you today. Even though you declined this today, please call our office should you change your mind, and we can give you the proper paperwork for you to fill out.   Conditions/risks identified: Drink 6-8 glasses of water and eat lots of fruits and vegetables.   Next appointment: Follow up in one year for your annual wellness visit 2024.   Preventive Care 21 Years and Older, Female Preventive care refers to lifestyle choices and visits with your health care provider that can promote health and wellness. What does preventive care include? A yearly physical exam. This is also called an annual well check. Dental exams once or twice a year. Routine eye exams. Ask your health care provider how often you should have your eyes checked. Personal lifestyle choices, including: Daily care of your teeth and gums. Regular physical activity. Eating a healthy diet. Avoiding tobacco and drug use. Limiting alcohol use. Practicing safe sex. Taking low-dose aspirin every day. Taking vitamin and mineral supplements as  recommended by your health care provider. What happens during an annual well check? The services and screenings done by your health care provider during your annual well check will depend on your age, overall health, lifestyle risk factors, and family history of disease. Counseling  Your health care provider may ask you questions about your: Alcohol use. Tobacco use. Drug use. Emotional well-being. Home and relationship well-being. Sexual activity. Eating habits. History of falls. Memory and ability to understand (cognition). Work and work Astronomer. Reproductive health. Screening  You may have the following tests or measurements: Height, weight, and BMI. Blood pressure. Lipid and cholesterol levels. These may be checked every 5 years, or more frequently if you are over 80 years old. Skin check. Lung cancer screening. You may have this screening every year starting at age 13 if you have a 30-pack-year history of smoking and currently smoke or have quit within the past 15 years. Fecal occult blood test (FOBT) of the stool. You may have this test every year starting at age 38. Flexible sigmoidoscopy or colonoscopy. You may have a sigmoidoscopy every 5 years or a colonoscopy every 10 years starting at age 39. Hepatitis C blood test. Hepatitis B blood test. Sexually transmitted disease (STD) testing. Diabetes screening. This is done by checking your blood sugar (glucose) after you have not eaten for a while (fasting). You may have this done every 1-3 years. Bone density scan. This is done to screen for osteoporosis. You may have this done starting at age 68. Mammogram. This may be done every 1-2 years. Talk to your health care provider about how often you should have regular mammograms. Talk with your health care provider about your test results, treatment options, and if  necessary, the need for more tests. Vaccines  Your health care provider may recommend certain vaccines, such  as: Influenza vaccine. This is recommended every year. Tetanus, diphtheria, and acellular pertussis (Tdap, Td) vaccine. You may need a Td booster every 10 years. Zoster vaccine. You may need this after age 33. Pneumococcal 13-valent conjugate (PCV13) vaccine. One dose is recommended after age 53. Pneumococcal polysaccharide (PPSV23) vaccine. One dose is recommended after age 13. Talk to your health care provider about which screenings and vaccines you need and how often you need them. This information is not intended to replace advice given to you by your health care provider. Make sure you discuss any questions you have with your health care provider. Document Released: 01/09/2016 Document Revised: 09/01/2016 Document Reviewed: 10/14/2015 Elsevier Interactive Patient Education  2017 [place] Prevention in the Home Falls can cause injuries. They can happen to people of all ages. There are many things you can do to make your home safe and to help prevent falls. What can I do on the outside of my home? Regularly fix the edges of walkways and driveways and fix any cracks. Remove anything that might make you trip as you walk through a door, such as a raised step or threshold. Trim any bushes or trees on the path to your home. Use bright outdoor lighting. Clear any walking paths of anything that might make someone trip, such as rocks or tools. Regularly check to see if handrails are loose or broken. Make sure that both sides of any steps have handrails. Any raised decks and porches should have guardrails on the edges. Have any leaves, snow, or ice cleared regularly. Use sand or salt on walking paths during winter. Clean up any spills in your garage right away. This includes oil or grease spills. What can I do in the bathroom? Use night lights. Install grab bars by the toilet and in the tub and shower. Do not use towel bars as grab bars. Use non-skid mats or decals in the tub or  shower. If you need to sit down in the shower, use a plastic, non-slip stool. Keep the floor dry. Clean up any water that spills on the floor as soon as it happens. Remove soap buildup in the tub or shower regularly. Attach bath mats securely with double-sided non-slip rug tape. Do not have throw rugs and other things on the floor that can make you trip. What can I do in the bedroom? Use night lights. Make sure that you have a light by your bed that is easy to reach. Do not use any sheets or blankets that are too big for your bed. They should not hang down onto the floor. Have a firm chair that has side arms. You can use this for support while you get dressed. Do not have throw rugs and other things on the floor that can make you trip. What can I do in the kitchen? Clean up any spills right away. Avoid walking on wet floors. Keep items that you use a lot in easy-to-reach places. If you need to reach something above you, use a strong step stool that has a grab bar. Keep electrical cords out of the way. Do not use floor polish or wax that makes floors slippery. If you must use wax, use non-skid floor wax. Do not have throw rugs and other things on the floor that can make you trip. What can I do with my stairs? Do not leave any items  on the stairs. Make sure that there are handrails on both sides of the stairs and use them. Fix handrails that are broken or loose. Make sure that handrails are as long as the stairways. Check any carpeting to make sure that it is firmly attached to the stairs. Fix any carpet that is loose or worn. Avoid having throw rugs at the top or bottom of the stairs. If you do have throw rugs, attach them to the floor with carpet tape. Make sure that you have a light switch at the top of the stairs and the bottom of the stairs. If you do not have them, ask someone to add them for you. What else can I do to help prevent falls? Wear shoes that: Do not have high heels. Have  rubber bottoms. Are comfortable and fit you well. Are closed at the toe. Do not wear sandals. If you use a stepladder: Make sure that it is fully opened. Do not climb a closed stepladder. Make sure that both sides of the stepladder are locked into place. Ask someone to hold it for you, if possible. Clearly mark and make sure that you can see: Any grab bars or handrails. First and last steps. Where the edge of each step is. Use tools that help you move around (mobility aids) if they are needed. These include: Canes. Walkers. Scooters. Crutches. Turn on the lights when you go into a dark area. Replace any light bulbs as soon as they burn out. Set up your furniture so you have a clear path. Avoid moving your furniture around. If any of your floors are uneven, fix them. If there are any pets around you, be aware of where they are. Review your medicines with your doctor. Some medicines can make you feel dizzy. This can increase your chance of falling. Ask your doctor what other things that you can do to help prevent falls. This information is not intended to replace advice given to you by your health care provider. Make sure you discuss any questions you have with your health care provider. Document Released: 10/09/2009 Document Revised: 05/20/2016 Document Reviewed: 01/17/2015 Elsevier Interactive Patient Education  2017 [employer].

## 2022-01-18 ENCOUNTER — Other Ambulatory Visit: Payer: Self-pay | Admitting: Family Medicine

## 2022-03-03 ENCOUNTER — Telehealth: Payer: Self-pay

## 2022-03-03 ENCOUNTER — Other Ambulatory Visit: Payer: Self-pay | Admitting: Family Medicine

## 2022-03-03 MED ORDER — CEPHALEXIN 500 MG PO CAPS: 500.0000 mg | ORAL_CAPSULE | Freq: Three times a day (TID) | ORAL | 0 refills | Status: DC

## 2022-03-03 NOTE — Telephone Encounter (Signed)
Received call from patient's daughter to report patient has been having dysuria for several days. She denies f/c/n/v/d or abd pain. She states patient does not urinate during the day, only once she has gone to bed. Patient wears Depends and it is soaked when she gets up in the morning. She would like to know if you can send in antibiotic for her. She states patient is unable to leave the house due to her physical condition.  ? ?Please advise, thanks! ?

## 2022-03-03 NOTE — Telephone Encounter (Signed)
Spoke with patient's daughter and advised of rx. Also advised if symptoms are not improving in 3 days patient will need OV for testing and urine culture. She voiced understanding. Nothing further needed at this time.  ? ?

## 2022-03-25 ENCOUNTER — Other Ambulatory Visit: Payer: Self-pay | Admitting: Family Medicine

## 2022-03-25 DIAGNOSIS — E039 Hypothyroidism, unspecified: Secondary | ICD-10-CM

## 2022-04-22 ENCOUNTER — Other Ambulatory Visit: Payer: Self-pay | Admitting: Family Medicine

## 2022-06-11 ENCOUNTER — Other Ambulatory Visit: Payer: Self-pay | Admitting: Family Medicine

## 2022-06-11 NOTE — Telephone Encounter (Signed)
Requested Prescriptions  Pending Prescriptions Disp Refills  . losartan (COZAAR) 100 MG tablet [Pharmacy Med Name: Losartan Potassium 100 MG Oral Tablet] 90 tablet 0    Sig: Take 1 tablet by mouth once daily     Cardiovascular:  Angiotensin Receptor Blockers Failed - 06/11/2022  2:00 PM      Failed - Cr in normal range and within 180 days    Creat  Date Value Ref Range Status  03/30/2021 1.45 (H) 0.60 - 0.88 mg/dL Final    Comment:    For patients >56 years of age, the reference limit for Creatinine is approximately 13% higher for people identified as African-American. .          Failed - K in normal range and within 180 days    Potassium  Date Value Ref Range Status  03/30/2021 4.2 3.5 - 5.3 mmol/L Final         Failed - Valid encounter within last 6 months    Recent Outpatient Visits          8 months ago Dysuria   [facility] Donita Brooks, MD   1 year ago Controlled type 2 diabetes mellitus without complication, without long-term current use of insulin (HCC)   [facility] Donita Brooks, MD   1 year ago Controlled type 2 diabetes mellitus without complication, without long-term current use of insulin (HCC)   [facility] Pickard, Priscille Heidelberg, MD   3 years ago Hematuria, unspecified type   [facility] Donita Brooks, MD   3 years ago Acute right ankle pain   [facility] Pickard, Priscille Heidelberg, MD             Passed - Patient is not pregnant      Passed - Last BP in normal range    BP Readings from Last 1 Encounters:  09/17/21 136/88

## 2022-06-14 ENCOUNTER — Other Ambulatory Visit: Payer: Self-pay | Admitting: Family Medicine

## 2022-06-14 DIAGNOSIS — E039 Hypothyroidism, unspecified: Secondary | ICD-10-CM

## 2022-06-15 ENCOUNTER — Telehealth: Payer: Self-pay

## 2022-06-15 NOTE — Telephone Encounter (Signed)
No, pt has not been seen since 9/22, she needs to come in.   Pls call pt and let them know, the need an appointment

## 2022-06-15 NOTE — Telephone Encounter (Signed)
Ptoi's daughter called in asking if pt could get a med sent in to pharmacy for a UTI. Pt's daughter states that she will not be able to get pt to office or able to get her to pick up a sterile cup for a urine sample. Pt's daughter would like for an antibiotic to be called in to the [facility] pharmacy in [place]. Please advise.  Cb#: [phone]

## 2022-06-19 ENCOUNTER — Other Ambulatory Visit: Payer: Self-pay | Admitting: Family Medicine

## 2022-06-21 ENCOUNTER — Telehealth: Payer: Self-pay

## 2022-06-21 ENCOUNTER — Other Ambulatory Visit: Payer: Self-pay

## 2022-06-21 ENCOUNTER — Other Ambulatory Visit: Payer: Self-pay | Admitting: Family Medicine

## 2022-06-21 DIAGNOSIS — E039 Hypothyroidism, unspecified: Secondary | ICD-10-CM

## 2022-06-21 MED ORDER — MEMANTINE HCL 10 MG PO TABS: 10.0000 mg | ORAL_TABLET | Freq: Two times a day (BID) | ORAL | 0 refills | Status: DC

## 2022-06-21 MED ORDER — LEVOTHYROXINE SODIUM 50 MCG PO TABS: 50.0000 ug | ORAL_TABLET | Freq: Every day | ORAL | 0 refills | Status: DC

## 2022-06-21 MED ORDER — CEPHALEXIN 500 MG PO CAPS: 500.0000 mg | ORAL_CAPSULE | Freq: Three times a day (TID) | ORAL | 0 refills | Status: DC

## 2022-06-21 NOTE — Telephone Encounter (Signed)
Requested medication (s) are due for refill today: yes  Requested medication (s) are on the active medication list: yes  Last refill:  12/11/21  Future visit scheduled: no  Notes to clinic: Unable to refill per protocol, appointment needed. Called pt daughter listed on DPR, no answer unable to leave VM. Routing for approval.       Requested Prescriptions  Pending Prescriptions Disp Refills   memantine (NAMENDA) 10 MG tablet [Pharmacy Med Name: Memantine HCl 10 MG Oral Tablet] 90 tablet 0    Sig: Take 1 tablet by mouth twice daily     Neurology:  Alzheimer's Agents 2 Failed - 06/19/2022  5:30 AM      Failed - Cr in normal range and within 360 days    Creat  Date Value Ref Range Status  03/30/2021 1.45 (H) 0.60 - 0.88 mg/dL Final    Comment:    For patients >45 years of age, the reference limit for Creatinine is approximately 13% higher for people identified as African-American. .          Failed - eGFR is 5 or above and within 360 days    GFR, Est African American  Date Value Ref Range Status  03/30/2021 37 (L) > OR = 60 mL/min/1.11m2 Final   GFR, Est Non African American  Date Value Ref Range Status  03/30/2021 32 (L) > OR = 60 mL/min/1.35m2 Final         Failed - Valid encounter within last 6 months    Recent Outpatient Visits           9 months ago Dysuria   [facility] Donita Brooks, MD   1 year ago Controlled type 2 diabetes mellitus without complication, without long-term current use of insulin (HCC)   [facility] Donita Brooks, MD   1 year ago Controlled type 2 diabetes mellitus without complication, without long-term current use of insulin (HCC)   [facility] Pickard, Priscille Heidelberg, MD   3 years ago Hematuria, unspecified type   [facility] Donita Brooks, MD   3 years ago Acute right ankle pain   [facility] Pickard, Priscille Heidelberg, MD

## 2022-06-23 ENCOUNTER — Other Ambulatory Visit: Payer: Self-pay

## 2022-06-23 DIAGNOSIS — E039 Hypothyroidism, unspecified: Secondary | ICD-10-CM

## 2022-06-23 NOTE — Telephone Encounter (Signed)
Pharmacy faxed a refill request for levothyroxine (SYNTHROID) 50 MCG tablet [[id number]]    Order Details Dose: 50 mcg Route: Oral Frequency: Daily before breakfast  Dispense Quantity: 90 tablet Refills: 0        Sig: Take 1 tablet (50 mcg total) by mouth daily before breakfast.       Start Date: 06/21/22 End Date: --  Written Date: 06/21/22 Expiration Date: 06/21/23

## 2022-06-23 NOTE — Telephone Encounter (Signed)
Duplicate request- 06/21/22 #90 Requested Prescriptions  Pending Prescriptions Disp Refills  . levothyroxine (SYNTHROID) 50 MCG tablet 90 tablet 0    Sig: Take 1 tablet (50 mcg total) by mouth daily before breakfast.     Endocrinology:  Hypothyroid Agents Failed - 06/23/2022  2:48 PM      Failed - TSH in normal range and within 360 days    TSH  Date Value Ref Range Status  03/30/2021 4.00 0.40 - 4.50 mIU/L Final         Passed - Valid encounter within last 12 months    Recent Outpatient Visits          9 months ago Dysuria   [facility] Donita Brooks, MD   1 year ago Controlled type 2 diabetes mellitus without complication, without long-term current use of insulin (HCC)   [facility] Donita Brooks, MD   1 year ago Controlled type 2 diabetes mellitus without complication, without long-term current use of insulin (HCC)   [facility] Pickard, Priscille Heidelberg, MD   3 years ago Hematuria, unspecified type   [facility] Donita Brooks, MD   3 years ago Acute right ankle pain   [facility] Pickard, Priscille Heidelberg, MD      Future Appointments            In 1 week Pickard, Priscille Heidelberg, MD [facility]

## 2022-07-06 ENCOUNTER — Ambulatory Visit: Payer: Medicare Other | Admitting: Family Medicine

## 2022-07-13 ENCOUNTER — Encounter: Payer: Self-pay | Admitting: Family Medicine

## 2022-07-13 ENCOUNTER — Ambulatory Visit (INDEPENDENT_AMBULATORY_CARE_PROVIDER_SITE_OTHER): Payer: Medicare Other | Admitting: Family Medicine

## 2022-07-13 VITALS — BP 112/62 | HR 59 | Temp 97.7°F | Wt 131.8 lb

## 2022-07-13 DIAGNOSIS — J431 Panlobular emphysema: Secondary | ICD-10-CM | POA: Diagnosis not present

## 2022-07-13 DIAGNOSIS — E119 Type 2 diabetes mellitus without complications: Secondary | ICD-10-CM | POA: Diagnosis not present

## 2022-07-13 DIAGNOSIS — R413 Other amnesia: Secondary | ICD-10-CM

## 2022-07-13 DIAGNOSIS — I10 Essential (primary) hypertension: Secondary | ICD-10-CM | POA: Diagnosis not present

## 2022-07-13 DIAGNOSIS — E039 Hypothyroidism, unspecified: Secondary | ICD-10-CM | POA: Diagnosis not present

## 2022-07-13 MED ORDER — SIMVASTATIN 20 MG PO TABS: 20.0000 mg | ORAL_TABLET | Freq: Every day | ORAL | 1 refills | Status: DC

## 2022-07-13 NOTE — Progress Notes (Signed)
Subjective:    Patient ID: Taylor Sawyer, female    DOB: 26-Nov-1930, 88 y.o.   MRN: [id number]  Patient is a very frail 88 year old Caucasian female with a history of COPD who continues to smoke as well as dementia.  He is here today with her daughter and her son-in-law.  She is using Trelegy on a daily basis to manage her emphysema.  She seldom has to use her albuterol even though she continues to smoke.  Her daughter states that she is extremely sedentary and essentially just sits in the chair all day.  The patient denies any chest pain or shortness of breath.  They state that she does not talk.  However at night, she experiences sundowning.  She will see people who are not there.  However none of these are concerning her.  She does not get upset or violent.  She is often laughing and having conversations.  Therefore the hallucinations do not seem to be troublesome to her.  She is incontinent of urine.  Patient is unaware that she has to go to the bathroom.  She recently was treated for a bladder infection.  Her blood pressure today is adequately controlled.  Family does complain that she is dealing with constipation.  They are not giving her regular stool softener.  I recommended trying MiraLAX 1-2 times a day if necessary Past Medical History:  Diagnosis Date   Asthma    B12 deficiency    COPD (chronic obstructive pulmonary disease) ([place])    Diabetes mellitus    Hyperlipidemia    Hypertension    Hypothyroidism    Memory loss    Tobacco user     Current Outpatient Medications on File Prior to Visit  Medication Sig Dispense Refill   albuterol (PROVENTIL HFA;VENTOLIN HFA) 108 (90 Base) MCG/ACT inhaler Inhale 2 puffs into the lungs every 6 (six) hours as needed for wheezing or shortness of breath. 1 Inhaler 0   fluticasone (FLONASE) 50 MCG/ACT nasal spray Place 2 sprays into both nostrils daily. 16 g 6   fluticasone (FLONASE) 50 MCG/ACT nasal spray Place 2 sprays into both nostrils daily. 16  g 6   Fluticasone-Umeclidin-Vilant (TRELEGY ELLIPTA) 100-62.5-25 MCG/INH AEPB Inhale 1 Inhaler into the lungs daily. 1 each 11   levothyroxine (SYNTHROID) 50 MCG tablet Take 1 tablet (50 mcg total) by mouth daily before breakfast. 90 tablet 0   losartan (COZAAR) 100 MG tablet Take 1 tablet by mouth once daily 90 tablet 0   memantine (NAMENDA) 10 MG tablet Take 1 tablet (10 mg total) by mouth 2 (two) times daily. 90 tablet 0   simvastatin (ZOCOR) 20 MG tablet TAKE 1 TABLET BY MOUTH AT BEDTIME 90 tablet 1   SYRINGE-NEEDLE, DISP, 3 ML (LUER LOCK SAFETY SYRINGES) 25G X 1" 3 ML MISC Use with B12 injections 50 each 1   cephALEXin (KEFLEX) 500 MG capsule Take 1 capsule (500 mg total) by mouth 3 (three) times daily. (Patient not taking: Reported on 07/13/2022) 21 capsule 0   cyanocobalamin (,VITAMIN B-12,) 1000 MCG/ML injection Inject  1 ml IM monthly (Patient not taking: Reported on 07/13/2022) 10 mL 2   No current facility-administered medications on file prior to visit.   Allergies  Allergen Reactions   Prednisone     Cramps.   Social History   Socioeconomic History   Marital status: Widowed    Spouse name: Not on file   Number of children: Not on file   Years of education:  Not on file   Highest education level: Not on file  Occupational History   Not on file  Tobacco Use   Smoking status: Every Day   Smokeless tobacco: Never  Substance and Sexual Activity   Alcohol use: Never   Drug use: Never   Sexual activity: Not Currently  Other Topics Concern   Not on file  Social History Narrative   Pt lived with daughter Rinaldo Cloud and son in Social worker.    Social Determinants of Health   Financial Resource Strain: Low Risk  (01/01/2022)   Overall Financial Resource Strain (CARDIA)    Difficulty of Paying Living Expenses: Not hard at all  Food Insecurity: No Food Insecurity (01/01/2022)   Hunger Vital Sign    Worried About Running Out of Food in the Last Year: Never true    Ran Out of Food in the  Last Year: Never true  Transportation Needs: No Transportation Needs (01/01/2022)   PRAPARE - Administrator, Civil Service (Medical): No    Lack of Transportation (Non-Medical): No  Physical Activity: Inactive (01/01/2022)   Exercise Vital Sign    Days of Exercise per Week: 0 days    Minutes of Exercise per Session: 0 min  Stress: No Stress Concern Present (01/01/2022)   [employer] of Occupational Health - Occupational Stress Questionnaire    Feeling of Stress : Not at all  Social Connections: Socially Isolated (01/01/2022)   Social Connection and Isolation Panel [NHANES]    Frequency of Communication with Friends and Family: More than three times a week    Frequency of Social Gatherings with Friends and Family: More than three times a week    Attends Religious Services: Never    Database administrator or Organizations: No    Attends Banker Meetings: Never    Marital Status: Widowed  Intimate Partner Violence: Not At Risk (01/01/2022)   Humiliation, Afraid, Rape, and Kick questionnaire    Fear of Current or Ex-Partner: No    Emotionally Abused: No    Physically Abused: No    Sexually Abused: No     Review of Systems  All other systems reviewed and are negative.      Objective:   Physical Exam Vitals reviewed.  Constitutional:      General: She is not in acute distress.    Appearance: She is not ill-appearing or toxic-appearing.  HENT:     Left Ear: External ear normal.     Nose: Nose normal. No congestion or rhinorrhea.     Mouth/Throat:     Pharynx: No oropharyngeal exudate.  Eyes:     Conjunctiva/sclera: Conjunctivae normal.  Neck:     Vascular: No carotid bruit.  Cardiovascular:     Rate and Rhythm: Normal rate and regular rhythm.     Pulses: Normal pulses.     Heart sounds: Normal heart sounds. No murmur heard.    No friction rub. No gallop.  Pulmonary:     Effort: Pulmonary effort is normal. No respiratory distress.     Breath  sounds: No wheezing, rhonchi or rales.  Abdominal:     General: Bowel sounds are normal. There is no distension.     Palpations: Abdomen is soft.     Tenderness: There is no abdominal tenderness. There is no guarding or rebound.  Musculoskeletal:     Cervical back: Neck supple.     Right lower leg: No edema.     Left lower leg: No  edema.     Right ankle: No proximal fibula tenderness.  Lymphadenopathy:     Cervical: No cervical adenopathy.  Skin:    General: Skin is warm.     Findings: No erythema or rash.  Neurological:     General: No focal deficit present.     Mental Status: She is alert.     Cranial Nerves: No cranial nerve deficit.     Gait: Gait abnormal.  Psychiatric:        Attention and Perception: She is inattentive.        Mood and Affect: Affect is flat.        Speech: She is noncommunicative.        Behavior: Behavior is slowed.        Cognition and Memory: Cognition is impaired. Memory is impaired. She exhibits impaired recent memory and impaired remote memory.           Assessment & Plan:  Controlled type 2 diabetes mellitus without complication, without long-term current use of insulin (HCC) - Plan: CBC with Differential/Platelet, COMPLETE METABOLIC PANEL WITH GFR, TSH, Hemoglobin A1c  Essential hypertension - Plan: CBC with Differential/Platelet, COMPLETE METABOLIC PANEL WITH GFR, TSH  Hypothyroidism, unspecified type - Plan: TSH  Panlobular emphysema (HCC)  Memory loss Patient has end-stage dementia.  However, she does not appear to be in any pain.  She does have hallucinations but they do not seem to be causing her any discomfort or anxiety.  She continues to smoke but she does not appear to be suffering from shortness of breath or having recurrent infections.  I will check baseline lab work including a CBC, CMP, A1c, and TSH.  As long as her lab work is stable I will not make any changes.  I did recommend using MiraLAX 1-2 times daily to help prevent  fecal impaction and severe constipation.

## 2022-07-26 ENCOUNTER — Other Ambulatory Visit: Payer: Self-pay | Admitting: Family Medicine

## 2022-07-27 NOTE — Telephone Encounter (Signed)
Requested medication (s) are due for refill today: in 9 days, rx written for #90 but it is twice a day so only 45 day supply  Requested medication (s) are on the active medication list: yes  Last refill:  06/21/22 #90 with 0 RF (45 day supply)  Future visit scheduled: no, just seen 07/13/22  Notes to clinic:  Labs were ordered at last visit but not drawn, labs are from 03/30/2021 so this failed lab protocol of within 12 months, please assess.      Requested Prescriptions  Pending Prescriptions Disp Refills   memantine (NAMENDA) 10 MG tablet [Pharmacy Med Name: Memantine HCl 10 MG Oral Tablet] 90 tablet 0    Sig: Take 1 tablet by mouth twice daily     Neurology:  Alzheimer's Agents 2 Failed - 07/26/2022  1:22 PM      Failed - Cr in normal range and within 360 days    Creat  Date Value Ref Range Status  03/30/2021 1.45 (H) 0.60 - 0.88 mg/dL Final    Comment:    For patients >40 years of age, the reference limit for Creatinine is approximately 13% higher for people identified as African-American. .          Failed - eGFR is 5 or above and within 360 days    GFR, Est African American  Date Value Ref Range Status  03/30/2021 37 (L) > OR = 60 mL/min/1.71m Final   GFR, Est Non African American  Date Value Ref Range Status  03/30/2021 32 (L) > OR = 60 mL/min/1.736mFinal         Failed - Valid encounter within last 6 months    Recent Outpatient Visits           10 months ago [place]Susy FrizzleMD   1 year ago Controlled type 2 diabetes mellitus without complication, without long-term current use of insulin ([place]  BrYantisiSusy FrizzleMD   1 year ago Controlled type 2 diabetes mellitus without complication, without long-term current use of insulin ([place]  BrMosineeickard, WaCammie McgeeMD   3 years ago Hematuria, unspecified type   BrBaileyWarren T, MD   3 years  ago Acute right ankle pain   BrShippensburgickard, WaCammie McgeeMD

## 2022-08-02 NOTE — Telephone Encounter (Signed)
Patient was seen on 07/13/22 by Dr. Tanya Nones

## 2022-08-05 ENCOUNTER — Telehealth: Payer: Self-pay

## 2022-08-05 NOTE — Telephone Encounter (Signed)
Per pt's daughter, pt is still c/o burning sensation whenever she urinates.   Pls advice

## 2022-08-06 ENCOUNTER — Other Ambulatory Visit: Payer: Self-pay | Admitting: Family Medicine

## 2022-08-06 MED ORDER — CEPHALEXIN 500 MG PO CAPS
500.0000 mg | ORAL_CAPSULE | Freq: Three times a day (TID) | ORAL | 0 refills | Status: DC
Start: 2022-08-06 — End: 2022-09-23

## 2022-08-06 NOTE — Telephone Encounter (Addendum)
Advice pt's daughter that Rx sent in to ph armacy.Daughter voiced understanding.

## 2022-08-07 ENCOUNTER — Other Ambulatory Visit: Payer: Self-pay | Admitting: Family Medicine

## 2022-08-09 ENCOUNTER — Telehealth: Payer: Self-pay

## 2022-08-09 ENCOUNTER — Other Ambulatory Visit: Payer: Self-pay

## 2022-08-09 MED ORDER — MEMANTINE HCL 10 MG PO TABS: 10.0000 mg | ORAL_TABLET | Freq: Two times a day (BID) | ORAL | 3 refills | Status: DC

## 2022-08-09 NOTE — Telephone Encounter (Addendum)
Pt's daughter called in stated that the pharmacy said that pt's med-Memantine has stopped?   Disregard. I just sent in refill again today.

## 2022-08-09 NOTE — Telephone Encounter (Signed)
QUESTION is there a reason this is always ordered in quantities of 90 when it is twice a  day. It always gets refused for refill but #90 is only 6 week supply. Can this be changed to #180 since it is twice a day.  Requested medication (s) are due for refill today: technically yes because #90 is only 6 weeks worth  Requested medication (s) are on the active medication list: yes  Last refill:  06/21/22  Future visit scheduled: no, seen 06/21/22  Notes to clinic:  Please assess why this is always ordered as #90 instead of #180 since it is twice a day.      Requested Prescriptions  Pending Prescriptions Disp Refills   memantine (NAMENDA) 10 MG tablet [Pharmacy Med Name: Memantine HCl 10 MG Oral Tablet] 90 tablet 0    Sig: Take 1 tablet by mouth twice daily     Neurology:  Alzheimer's Agents 2 Failed - 08/07/2022  6:50 AM      Failed - Cr in normal range and within 360 days    Creat  Date Value Ref Range Status  03/30/2021 1.45 (H) 0.60 - 0.88 mg/dL Final    Comment:    For patients >39 years of age, the reference limit for Creatinine is approximately 13% higher for people identified as African-American. .          Failed - eGFR is 5 or above and within 360 days    GFR, Est African American  Date Value Ref Range Status  03/30/2021 37 (L) > OR = 60 mL/min/1.65m Final   GFR, Est Non African American  Date Value Ref Range Status  03/30/2021 32 (L) > OR = 60 mL/min/1.740mFinal         Failed - Valid encounter within last 6 months    Recent Outpatient Visits           10 months ago [facility] [facility] FrizzleMD   1 year ago Controlled type 2 diabetes mellitus without complication, without long-term current use of insulin ([place]  BrMon[place] MilliSusy FrizzleMD   1 year ago Controlled type 2 diabetes mellitus without complication, without long-term current use of insulin ([facility]  BrEast Dundeeickard, WaCammie Mcgee MD   3 years ago Hematuria, unspecified type   BrEdgewoodWarren T, MD   3 years ago Acute right ankle pain   BrOak Leafickard, WaCammie McgeeMD

## 2022-09-22 ENCOUNTER — Telehealth: Payer: Self-pay | Admitting: Family Medicine

## 2022-09-22 NOTE — Telephone Encounter (Signed)
Received call from patient's daugher and POA Taylor Sawyer to request refill of antibiotics. Taylor Sawyer stated patient's urine has a terrible odor, and is experiencing burning upon urination. Requesting for refill script to be sent to pharmacy for antibiotics. Taylor Sawyer is unable to bring patient to the office due to also caring for her 86 year old mother in law who has dementia.   Pharmacy confirmed as:  [facility] [address], [place] - [place]  [address], [place] [address]  Phone:  [phone]  Fax:  [phone]   LOV: 07/13/2022  Please advise at [phone].

## 2022-09-22 NOTE — Telephone Encounter (Signed)
Pt's daughter, Teofilo Pod called requesting Rx for pt. Pt c/o burning with urination. Can an Rx be sent in or do you want to see the patient? Thank you.

## 2022-09-23 ENCOUNTER — Other Ambulatory Visit: Payer: Self-pay | Admitting: Family Medicine

## 2022-09-23 MED ORDER — CEPHALEXIN 500 MG PO CAPS: 500.0000 mg | ORAL_CAPSULE | Freq: Three times a day (TID) | ORAL | 0 refills | Status: DC

## 2022-10-04 ENCOUNTER — Other Ambulatory Visit: Payer: Self-pay | Admitting: Family Medicine

## 2022-12-01 ENCOUNTER — Telehealth: Payer: Self-pay | Admitting: Family Medicine

## 2022-12-01 NOTE — Telephone Encounter (Signed)
No a valid number unable to leave a message for patient to call back and schedule Medicare Annual Wellness Visit (AWV) in office.   If not able to come in office, please offer to do virtually or by telephone.   Last AWV: 01/01/2022   Please schedule at anytime with [facility] Health Advisor Toni Amend  If any questions, please contact me at [phone].  Thank you ,  Judeth Cornfield

## 2022-12-08 ENCOUNTER — Telehealth: Payer: Self-pay

## 2022-12-08 NOTE — Telephone Encounter (Signed)
Pt's daughter, Rinaldo Cloud called stating pt is having issues with burning with urination. No fever. Rinaldo Cloud states she was advised to call the office if pt is having issues. Can an antibiotic be sent in for the patient? Thank you.  Uses [facility] in [place].

## 2022-12-09 ENCOUNTER — Other Ambulatory Visit: Payer: Self-pay | Admitting: Family Medicine

## 2022-12-09 MED ORDER — CEPHALEXIN 500 MG PO CAPS: 500.0000 mg | ORAL_CAPSULE | Freq: Three times a day (TID) | ORAL | 0 refills | Status: DC

## 2022-12-13 ENCOUNTER — Telehealth: Payer: Self-pay

## 2022-12-13 ENCOUNTER — Other Ambulatory Visit: Payer: Self-pay | Admitting: Family Medicine

## 2022-12-13 MED ORDER — MOLNUPIRAVIR EUA 200MG CAPSULE: 4.0000 | ORAL_CAPSULE | Freq: Two times a day (BID) | ORAL | 0 refills | Status: AC

## 2022-12-13 NOTE — Telephone Encounter (Signed)
Pt's daughter, Elita Quick, called stated that pt has been tested positive for Covid. Request antibiotics for Covid to be sent into [facility] in [place].

## 2022-12-14 NOTE — Telephone Encounter (Addendum)
Pam, pt's daughter called to f/u covid antibiotics. Advice pt that it has been sent to [facility] last night per pcp. Pam, said she will check with pharmacy to see if it's available, if not will call back

## 2022-12-14 NOTE — Telephone Encounter (Signed)
Attempted to call pt's daughter re pt's covid a antibiotics. LVM  and that it has been sent into pt's pharmacy.

## 2023-01-12 ENCOUNTER — Other Ambulatory Visit: Payer: Self-pay | Admitting: Family Medicine

## 2023-01-12 DIAGNOSIS — E039 Hypothyroidism, unspecified: Secondary | ICD-10-CM

## 2023-01-12 NOTE — Telephone Encounter (Signed)
Requested Prescriptions  Pending Prescriptions Disp Refills   levothyroxine (SYNTHROID) 50 MCG tablet [Pharmacy Med Name: Levothyroxine Sodium 50 MCG Oral Tablet] 90 tablet 0    Sig: TAKE 1 TABLET BY MOUTH ONCE DAILY BEFORE BREAKFAST     Endocrinology:  Hypothyroid Agents Failed - 01/12/2023  2:19 PM      Failed - TSH in normal range and within 360 days    TSH  Date Value Ref Range Status  03/30/2021 4.00 0.40 - 4.50 mIU/L Final         Failed - Valid encounter within last 12 months    Recent Outpatient Visits           1 year ago [place] Susy Frizzle, MD   1 year ago Controlled type 2 diabetes mellitus without complication, without long-term current use of insulin ([place])   [place] Susy Frizzle, MD   2 years ago Controlled type 2 diabetes mellitus without complication, without long-term current use of insulin ([place])   [place] Pickard, Cammie Mcgee, MD   4 years ago Hematuria, unspecified type   [place], Warren T, MD   4 years ago Acute right ankle pain   [place] Pickard, Cammie Mcgee, MD

## 2023-01-13 ENCOUNTER — Other Ambulatory Visit: Payer: Self-pay | Admitting: Family Medicine

## 2023-01-13 NOTE — Telephone Encounter (Signed)
Requested medication (s) are due for refill today: yes  Requested medication (s) are on the active medication list: yes  Last refill:  10/04/22 #90 0 refills  Future visit scheduled: no  Notes to clinic:  no refills remain. Last labs 03/30/21. Last OV 07/13/22. Do you want to refill Rx?     Requested Prescriptions  Pending Prescriptions Disp Refills   losartan (COZAAR) 100 MG tablet [Pharmacy Med Name: Losartan Potassium 100 MG Oral Tablet] 90 tablet 0    Sig: Take 1 tablet by mouth once daily     Cardiovascular:  Angiotensin Receptor Blockers Failed - 01/13/2023  4:49 PM      Failed - Cr in normal range and within 180 days    Creat  Date Value Ref Range Status  03/30/2021 1.45 (H) 0.60 - 0.88 mg/dL Final    Comment:    For patients >73 years of age, the reference limit for Creatinine is approximately 13% higher for people identified as African-American. .          Failed - K in normal range and within 180 days    Potassium  Date Value Ref Range Status  03/30/2021 4.2 3.5 - 5.3 mmol/L Final         Failed - Valid encounter within last 6 months    Recent Outpatient Visits           1 year ago [place] Susy Frizzle, MD   1 year ago Controlled type 2 diabetes mellitus without complication, without long-term current use of insulin ([place])   [place] Susy Frizzle, MD   2 years ago Controlled type 2 diabetes mellitus without complication, without long-term current use of insulin ([place])   [place] Pickard, Cammie Mcgee, MD   4 years ago Hematuria, unspecified type   [place], Warren T, MD   4 years ago Acute right ankle pain   [place], Cammie Mcgee, MD              Passed - Patient is not pregnant      Passed - Last BP in normal range    BP Readings from Last 1 Encounters:  07/13/22 112/62

## 2023-01-17 IMAGING — CR DG CHEST 2V
2 series · 2 of 2 positions shown · non-contrast
Comparison: None.

CLINICAL DATA: 89-year-old female with left-sided crackles

EXAM:
CHEST - 2 VIEW

[w chest pa]
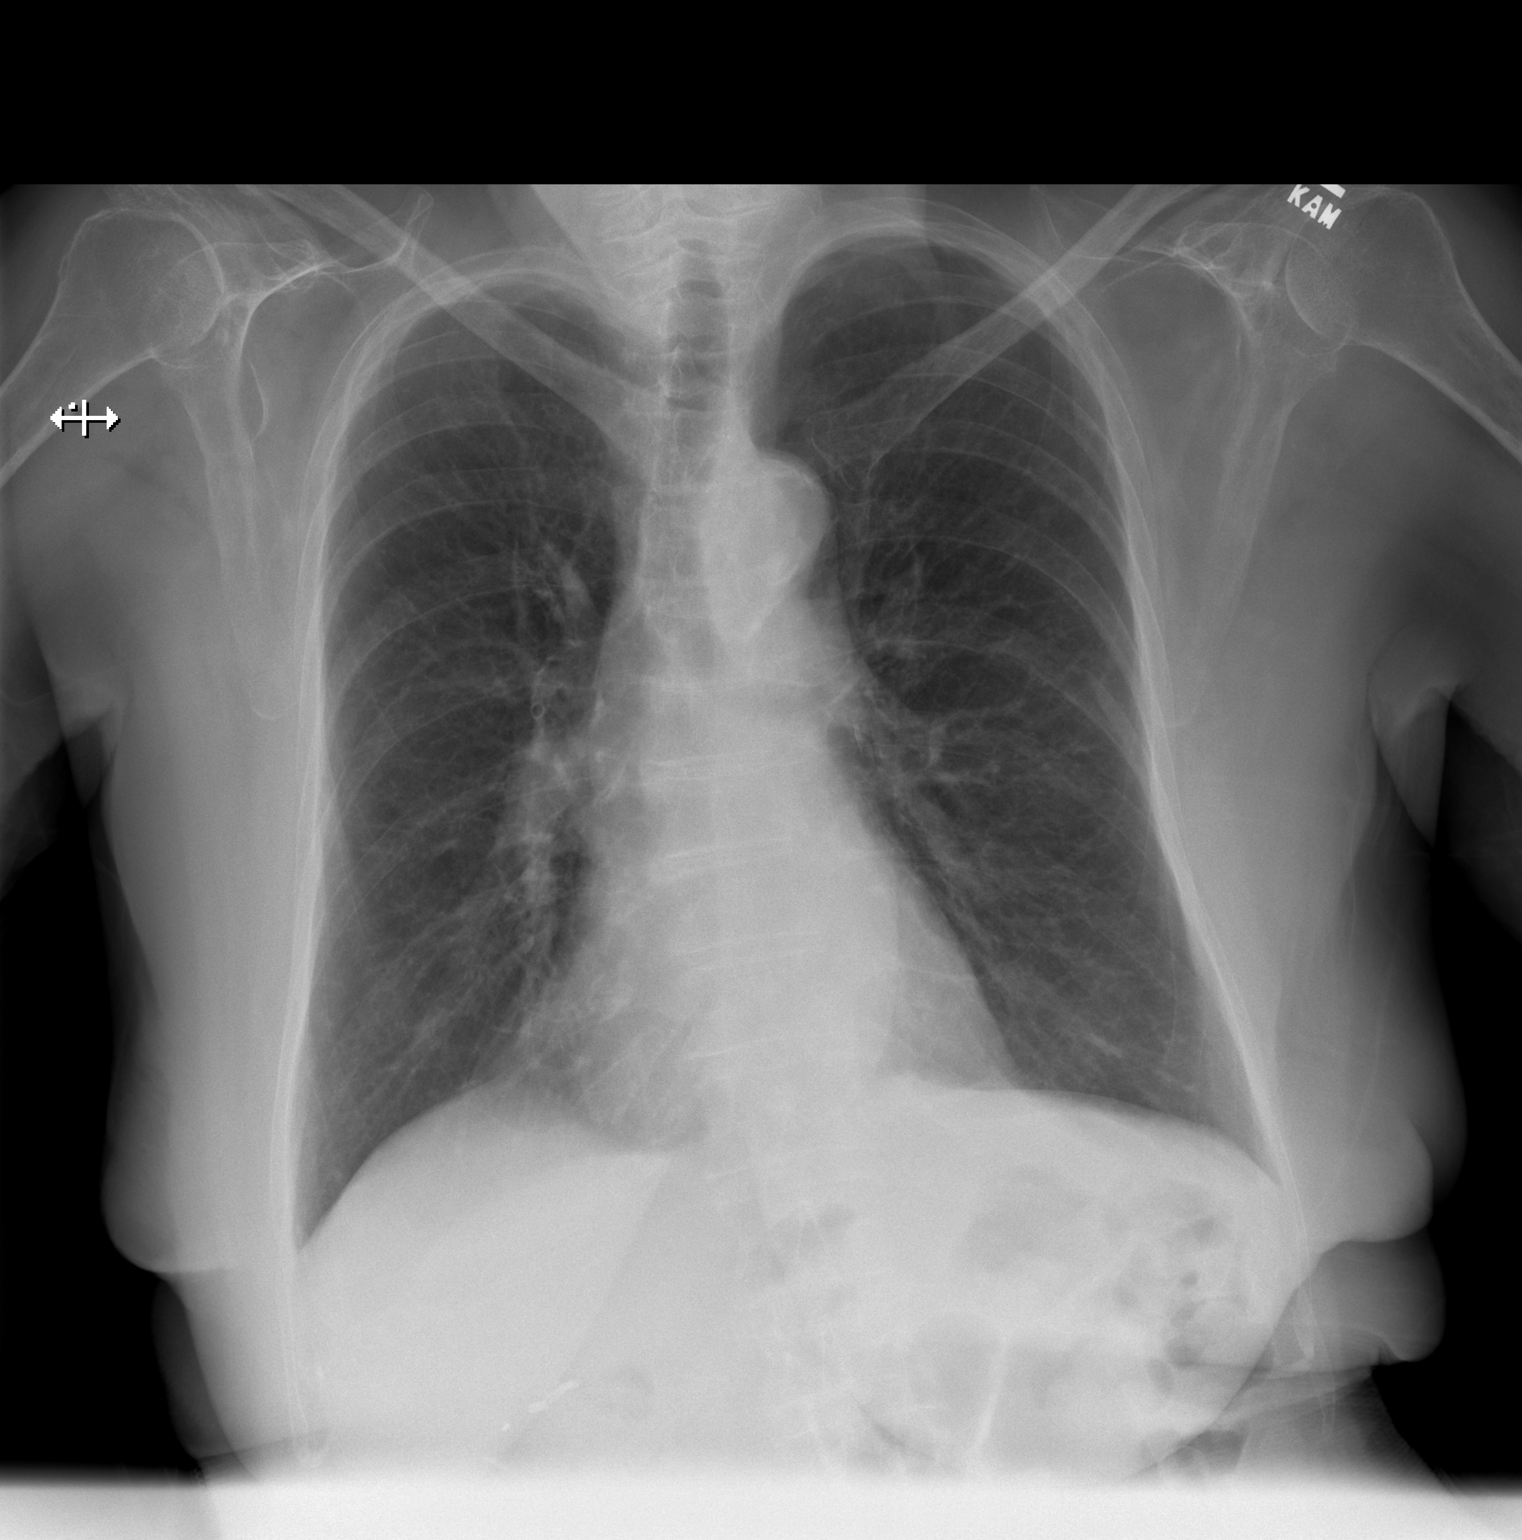

[w chest lat]
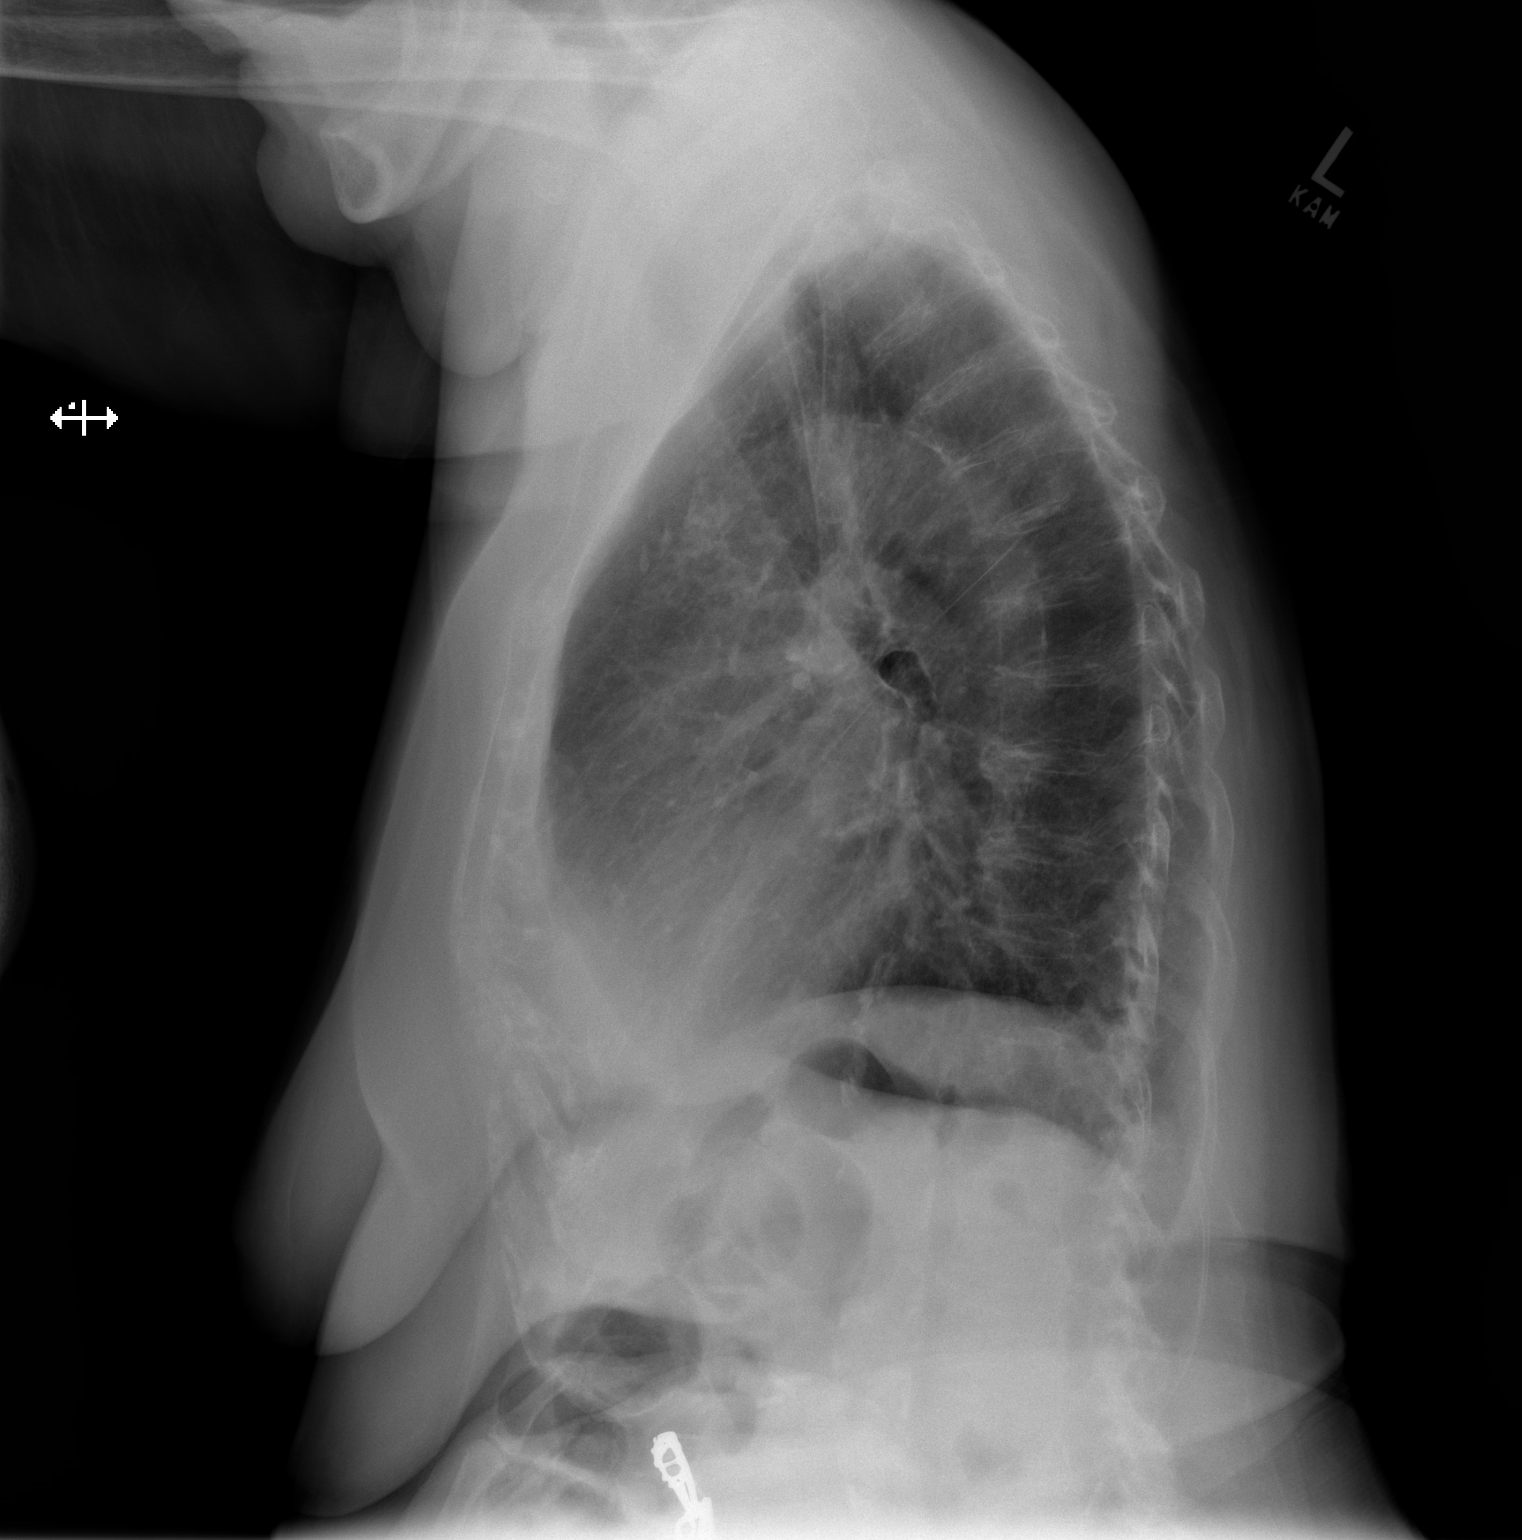

[2 of 2 positions shown; findings below may reference images not displayed]

FINDINGS: Cardiomediastinal silhouette unchanged in size and contour. No
evidence of central vascular congestion. No interlobular septal
thickening.

Stigmata of emphysema, with increased retrosternal airspace,
flattened hemidiaphragms, increased AP diameter, and hyperinflation
on the AP view.

No pneumothorax or pleural effusion. Coarsened interstitial
markings, with no confluent airspace disease.

No acute displaced fracture. Degenerative changes of the spine.
IMPRESSION: Emphysema and chronic lung changes without evidence of acute
cardiopulmonary disease

## 2023-02-14 NOTE — Progress Notes (Deleted)
Subjective:   Taylor Sawyer is a 89 y.o. female who presents for Medicare Annual (Subsequent) preventive examination.  Review of Systems    ***       Objective:    There were no vitals filed for this visit. There is no height or weight on file to calculate BMI.     01/01/2022    3:05 PM 08/22/2020   10:34 AM  Advanced Directives  Does Patient Have a Medical Advance Directive? No Yes  Type of Advance Directive  [place]  Would patient like information on creating a medical advance directive? No - Patient declined     Current Medications (verified) Outpatient Encounter Medications as of 02/15/2023  Medication Sig   albuterol (PROVENTIL HFA;VENTOLIN HFA) 108 (90 Base) MCG/ACT inhaler Inhale 2 puffs into the lungs every 6 (six) hours as needed for wheezing or shortness of breath.   cephALEXin (KEFLEX) 500 MG capsule Take 1 capsule (500 mg total) by mouth 3 (three) times daily.   cyanocobalamin (,VITAMIN B-12,) 1000 MCG/ML injection Inject  1 ml IM monthly (Patient not taking: Reported on 07/13/2022)   fluticasone (FLONASE) 50 MCG/ACT nasal spray Place 2 sprays into both nostrils daily.   fluticasone (FLONASE) 50 MCG/ACT nasal spray Place 2 sprays into both nostrils daily.   Fluticasone-Umeclidin-Vilant (TRELEGY ELLIPTA) 100-62.5-25 MCG/INH AEPB Inhale 1 Inhaler into the lungs daily.   levothyroxine (SYNTHROID) 50 MCG tablet TAKE 1 TABLET BY MOUTH ONCE DAILY BEFORE BREAKFAST   losartan (COZAAR) 100 MG tablet Take 1 tablet by mouth once daily   memantine (NAMENDA) 10 MG tablet Take 1 tablet (10 mg total) by mouth 2 (two) times daily.   simvastatin (ZOCOR) 20 MG tablet Take 1 tablet (20 mg total) by mouth at bedtime.   SYRINGE-NEEDLE, DISP, 3 ML (LUER LOCK SAFETY SYRINGES) 25G X 1" 3 ML MISC Use with B12 injections   No facility-administered encounter medications on file as of 02/15/2023.    Allergies (verified) Prednisone   History: Past Medical History:   Diagnosis Date   Asthma    B12 deficiency    COPD (chronic obstructive pulmonary disease) ([place])    Diabetes mellitus    Hyperlipidemia    Hypertension    Hypothyroidism    Memory loss    Tobacco user    Past Surgical History:  Procedure Laterality Date   THYROIDECTOMY     No family history on file. Social History   Socioeconomic History   Marital status: Widowed    Spouse name: Not on file   Number of children: Not on file   Years of education: Not on file   Highest education level: Not on file  Occupational History   Not on file  Tobacco Use   Smoking status: Every Day   Smokeless tobacco: Never  Substance and Sexual Activity   Alcohol use: Never   Drug use: Never   Sexual activity: Not Currently  Other Topics Concern   Not on file  Social History Narrative   Pt lived with daughter Olin Hauser and son in [place].    Social Determinants of Health   Financial Resource Strain: Low Risk  (01/01/2022)   Overall Financial Resource Strain (CARDIA)    Difficulty of Paying Living Expenses: Not hard at all  Food Insecurity: No Food Insecurity (01/01/2022)   Hunger Vital Sign    Worried About Running Out of Food in the Last Year: Never true    Ran Out of Food in the Last  Year: Never true  Transportation Needs: No Transportation Needs (01/01/2022)   PRAPARE - Hydrologist (Medical): No    Lack of Transportation (Non-Medical): No  Physical Activity: Inactive (01/01/2022)   Exercise Vital Sign    Days of Exercise per Week: 0 days    Minutes of Exercise per Session: 0 min  Stress: No Stress Concern Present (01/01/2022)   [place]    Feeling of Stress : Not at all  Social Connections: Socially Isolated (01/01/2022)   Social Connection and Isolation Panel [NHANES]    Frequency of Communication with Friends and Family: More than three times a week    Frequency of Social Gatherings with Friends and  Family: More than three times a week    Attends Religious Services: Never    Marine scientist or Organizations: No    Attends Archivist Meetings: Never    Marital Status: Widowed    Tobacco Counseling Ready to quit: Not Answered Counseling given: Not Answered   Clinical Intake:                 Diabetic?Yes   Nutrition Risk Assessment:  Has the patient had any N/V/D within the last 2 months?  {YES/NO:21197} Does the patient have any non-healing wounds?  {YES/NO:21197} Has the patient had any unintentional weight loss or weight gain?  {YES/NO:21197}  Diabetes:  Is the patient diabetic?  {YES/NO:21197} If diabetic, was a CBG obtained today?  {YES/NO:21197} Did the patient bring in their glucometer from home?  {YES/NO:21197} How often do you monitor your CBG's? ***.   Financial Strains and Diabetes Management:  Are you having any financial strains with the device, your supplies or your medication? {YES/NO:21197}.  Does the patient want to be seen by Chronic Care Management for management of their diabetes?  {YES/NO:21197} Would the patient like to be referred to a Nutritionist or for Diabetic Management?  {YES/NO:21197}  Diabetic Exams:  {Diabetic Eye Exam:2101801} {Diabetic Foot Exam:2101802}          Activities of Daily Living     No data to display          Patient Care Team: Susy Frizzle, MD as PCP - General (Family Medicine)  Indicate any recent Medical Services you may have received from other than [facility] providers in the past year (date may be approximate).     Assessment:   This is a routine wellness examination for Taylor Sawyer.  Hearing/Vision screen No results found.  Dietary issues and exercise activities discussed:     Goals Addressed   None    Depression Screen    01/01/2022    2:59 PM 09/17/2021    4:00 PM 08/22/2020   10:35 AM 10/13/2017    3:36 PM  PHQ 2/9 Scores  PHQ - 2 Score 0 0 3 0  PHQ- 9 Score   9  11    Fall Risk    01/01/2022    3:05 PM 09/17/2021    4:00 PM 08/22/2020   10:35 AM 07/06/2018    2:25 PM 10/13/2017    3:36 PM  [place] in the past year? 0 0 0 Yes No  Number falls in past yr: 0 0 0 2 or more   Injury with Fall? 0 0 0 Yes   Risk Factor Category     High Fall Risk   Risk for fall due to : Impaired  balance/gait;Impaired mobility;Impaired vision Impaired balance/gait;Impaired mobility  History of fall(s);Mental status change   Follow up Falls prevention discussed Falls evaluation completed  Falls evaluation completed     FALL RISK PREVENTION PERTAINING TO THE HOME:  Any stairs in or around the home? {YES/NO:21197} If so, are there any without handrails? {YES/NO:21197} Home free of loose throw rugs in walkways, pet beds, electrical cords, etc? {YES/NO:21197} Adequate lighting in your home to reduce risk of falls? {YES/NO:21197}  ASSISTIVE DEVICES UTILIZED TO PREVENT FALLS:  Life alert? {YES/NO:21197} Use of a cane, walker or w/c? {YES/NO:21197} Grab bars in the bathroom? {YES/NO:21197} Shower chair or bench in shower? {YES/NO:21197} Elevated toilet seat or a handicapped toilet? {YES/NO:21197}  TIMED UP AND GO:  Was the test performed? No . Telephonic visit   Cognitive Function:        Immunizations Immunization History  Administered Date(s) Administered   DTaP 06/27/2003   Influenza Whole 11/26/2004   Influenza,inj,Quad PF,6+ Mos 10/17/2014   PFIZER(Purple Top)SARS-COV-2 Vaccination 09/02/2020, 09/26/2020   Pneumococcal Conjugate-13 10/17/2014   Pneumococcal Polysaccharide-23 09/26/2002    {TDAP status:2101805}  {Flu Vaccine status:2101806}  Pneumococcal vaccine status: Up to date  Covid-19 vaccine status: Information provided on how to obtain vaccines.   Qualifies for Shingles Vaccine? Yes   Zostavax completed No   Shingrix Completed?: No.    Education has been provided regarding the importance of this vaccine. Patient has been  advised to call insurance company to determine out of pocket expense if they have not yet received this vaccine. Advised may also receive vaccine at local pharmacy or Health Dept. Verbalized acceptance and understanding.  Screening Tests Health Maintenance  Topic Date Due   FOOT EXAM  Never done   OPHTHALMOLOGY EXAM  Never done   Zoster Vaccines- Shingrix (1 of 2) Never done   DEXA SCAN  Never done   DTaP/Tdap/Td (2 - Tdap) 06/26/2013   HEMOGLOBIN A1C  09/29/2021   INFLUENZA VACCINE  07/27/2022   COVID-19 Vaccine (3 - 2023-24 season) 08/27/2022   Medicare Annual Wellness (AWV)  01/01/2023   Pneumonia Vaccine 75+ Years old  Completed   HPV VACCINES  Aged Out    Health Maintenance  Health Maintenance Due  Topic Date Due   FOOT EXAM  Never done   OPHTHALMOLOGY EXAM  Never done   Zoster Vaccines- Shingrix (1 of 2) Never done   DEXA SCAN  Never done   DTaP/Tdap/Td (2 - Tdap) 06/26/2013   HEMOGLOBIN A1C  09/29/2021   INFLUENZA VACCINE  07/27/2022   COVID-19 Vaccine (3 - 2023-24 season) 08/27/2022   Medicare Annual Wellness (AWV)  01/01/2023    Colorectal cancer screening: No longer required.   Mammogram status: No longer required due to age.  {Bone Density status:21018021}  Lung Cancer Screening: (Low Dose CT Chest recommended if Age 60-80 years, 30 pack-year currently smoking OR have quit w/in 15years.) does not qualify.   Lung Cancer Screening Referral: n/a  Additional Screening:  Hepatitis C Screening: does not qualify  Vision Screening: Recommended annual ophthalmology exams for early detection of glaucoma and other disorders of the eye. Is the patient up to date with their annual eye exam?  {YES/NO:21197} Who is the provider or what is the name of the office in which the patient attends annual eye exams? *** If pt is not established with a provider, would they like to be referred to a provider to establish care? {YES/NO:21197}.   Dental Screening: Recommended annual  dental exams for proper oral  hygiene  Community Resource Referral / Chronic Care Management: CRR required this visit?  {YES/NO:21197}  CCM required this visit?  {YES/NO:21197}     Plan:     I have personally reviewed and noted the following in the patient's chart:   Medical and social history Use of alcohol, tobacco or illicit drugs  Current medications and supplements including opioid prescriptions. {Opioid [phone]} Functional ability and status Nutritional status Physical activity Advanced directives List of other physicians Hospitalizations, surgeries, and ER visits in previous 12 months Vitals Screenings to include cognitive, depression, and falls Referrals and appointments  In addition, I have reviewed and discussed with patient certain preventive protocols, quality metrics, and best practice recommendations. A written personalized care plan for preventive services as well as general preventive health recommendations were provided to patient.     Vanetta Mulders, [place]   [id number]   Due to this being a virtual visit, the after visit summary with patients personalized plan was offered to patient via mail or my-chart. ***Patient declined at this time./ Patient would like to access on my-chart/ per request, patient was mailed a copy of AVS./ Patient preferred to pick up at office at next visit   Nurse Notes: ***

## 2023-02-14 NOTE — Patient Instructions (Incomplete)
Taylor Sawyer , Thank you for taking time to come for your Medicare Wellness Visit. I appreciate your ongoing commitment to your health goals. Please review the following plan we discussed and let me know if I can assist you in the future.   These are the goals we discussed:  Goals      Have 3 meals a day     Continue to maintain health.        This is a list of the screening recommended for you and due dates:  Health Maintenance  Topic Date Due   Complete foot exam   Never done   Eye exam for diabetics  Never done   Zoster (Shingles) Vaccine (1 of 2) Never done   DEXA scan (bone density measurement)  Never done   DTaP/Tdap/Td vaccine (2 - Tdap) 06/26/2013   Hemoglobin A1C  09/29/2021   Flu Shot  07/27/2022   COVID-19 Vaccine (3 - 2023-24 season) 08/27/2022   Medicare Annual Wellness Visit  01/01/2023   Pneumonia Vaccine  Completed   HPV Vaccine  Aged Out    Advanced directives: ***  Conditions/risks identified: Aim for 30 minutes of exercise or brisk walking, 6-8 glasses of water, and 5 servings of fruits and vegetables each day.   Next appointment: Follow up in one year for your annual wellness visit    Preventive Care 65 Years and Older, Female Preventive care refers to lifestyle choices and visits with your health care provider that can promote health and wellness. What does preventive care include? A yearly physical exam. This is also called an annual well check. Dental exams once or twice a year. Routine eye exams. Ask your health care provider how often you should have your eyes checked. Personal lifestyle choices, including: Daily care of your teeth and gums. Regular physical activity. Eating a healthy diet. Avoiding tobacco and drug use. Limiting alcohol use. Practicing safe sex. Taking low-dose aspirin every day. Taking vitamin and mineral supplements as recommended by your health care provider. What happens during an annual well check? The services and  screenings done by your health care provider during your annual well check will depend on your age, overall health, lifestyle risk factors, and family history of disease. Counseling  Your health care provider may ask you questions about your: Alcohol use. Tobacco use. Drug use. Emotional well-being. Home and relationship well-being. Sexual activity. Eating habits. History of falls. Memory and ability to understand (cognition). Work and work Statistician. Reproductive health. Screening  You may have the following tests or measurements: Height, weight, and BMI. Blood pressure. Lipid and cholesterol levels. These may be checked every 5 years, or more frequently if you are over 77 years old. Skin check. Lung cancer screening. You may have this screening every year starting at age 39 if you have a 30-pack-year history of smoking and currently smoke or have quit within the past 15 years. Fecal occult blood test (FOBT) of the stool. You may have this test every year starting at age 73. Flexible sigmoidoscopy or colonoscopy. You may have a sigmoidoscopy every 5 years or a colonoscopy every 10 years starting at age 50. Hepatitis C blood test. Hepatitis B blood test. Sexually transmitted disease (STD) testing. Diabetes screening. This is done by checking your blood sugar (glucose) after you have not eaten for a while (fasting). You may have this done every 1-3 years. Bone density scan. This is done to screen for osteoporosis. You may have this done starting at age 41.  Mammogram. This may be done every 1-2 years. Talk to your health care provider about how often you should have regular mammograms. Talk with your health care provider about your test results, treatment options, and if necessary, the need for more tests. Vaccines  Your health care provider may recommend certain vaccines, such as: Influenza vaccine. This is recommended every year. Tetanus, diphtheria, and acellular pertussis (Tdap,  Td) vaccine. You may need a Td booster every 10 years. Zoster vaccine. You may need this after age 49. Pneumococcal 13-valent conjugate (PCV13) vaccine. One dose is recommended after age 55. Pneumococcal polysaccharide (PPSV23) vaccine. One dose is recommended after age 52. Talk to your health care provider about which screenings and vaccines you need and how often you need them. This information is not intended to replace advice given to you by your health care provider. Make sure you discuss any questions you have with your health care provider. Document Released: 01/09/2016 Document Revised: 09/01/2016 Document Reviewed: 10/14/2015 Elsevier Interactive Patient Education  2017 [place] Prevention in the Home Falls can cause injuries. They can happen to people of all ages. There are many things you can do to make your home safe and to help prevent falls. What can I do on the outside of my home? Regularly fix the edges of walkways and driveways and fix any cracks. Remove anything that might make you trip as you walk through a door, such as a raised step or threshold. Trim any bushes or trees on the path to your home. Use bright outdoor lighting. Clear any walking paths of anything that might make someone trip, such as rocks or tools. Regularly check to see if handrails are loose or broken. Make sure that both sides of any steps have handrails. Any raised decks and porches should have guardrails on the edges. Have any leaves, snow, or ice cleared regularly. Use sand or salt on walking paths during winter. Clean up any spills in your garage right away. This includes oil or grease spills. What can I do in the bathroom? Use night lights. Install grab bars by the toilet and in the tub and shower. Do not use towel bars as grab bars. Use non-skid mats or decals in the tub or shower. If you need to sit down in the shower, use a plastic, non-slip stool. Keep the floor dry. Clean up any  water that spills on the floor as soon as it happens. Remove soap buildup in the tub or shower regularly. Attach bath mats securely with double-sided non-slip rug tape. Do not have throw rugs and other things on the floor that can make you trip. What can I do in the bedroom? Use night lights. Make sure that you have a light by your bed that is easy to reach. Do not use any sheets or blankets that are too big for your bed. They should not hang down onto the floor. Have a firm chair that has side arms. You can use this for support while you get dressed. Do not have throw rugs and other things on the floor that can make you trip. What can I do in the kitchen? Clean up any spills right away. Avoid walking on wet floors. Keep items that you use a lot in easy-to-reach places. If you need to reach something above you, use a strong step stool that has a grab bar. Keep electrical cords out of the way. Do not use floor polish or wax that makes floors slippery. If  you must use wax, use non-skid floor wax. Do not have throw rugs and other things on the floor that can make you trip. What can I do with my stairs? Do not leave any items on the stairs. Make sure that there are handrails on both sides of the stairs and use them. Fix handrails that are broken or loose. Make sure that handrails are as long as the stairways. Check any carpeting to make sure that it is firmly attached to the stairs. Fix any carpet that is loose or worn. Avoid having throw rugs at the top or bottom of the stairs. If you do have throw rugs, attach them to the floor with carpet tape. Make sure that you have a light switch at the top of the stairs and the bottom of the stairs. If you do not have them, ask someone to add them for you. What else can I do to help prevent falls? Wear shoes that: Do not have high heels. Have rubber bottoms. Are comfortable and fit you well. Are closed at the toe. Do not wear sandals. If you use a  stepladder: Make sure that it is fully opened. Do not climb a closed stepladder. Make sure that both sides of the stepladder are locked into place. Ask someone to hold it for you, if possible. Clearly mark and make sure that you can see: Any grab bars or handrails. First and last steps. Where the edge of each step is. Use tools that help you move around (mobility aids) if they are needed. These include: Canes. Walkers. Scooters. Crutches. Turn on the lights when you go into a dark area. Replace any light bulbs as soon as they burn out. Set up your furniture so you have a clear path. Avoid moving your furniture around. If any of your floors are uneven, fix them. If there are any pets around you, be aware of where they are. Review your medicines with your doctor. Some medicines can make you feel dizzy. This can increase your chance of falling. Ask your doctor what other things that you can do to help prevent falls. This information is not intended to replace advice given to you by your health care provider. Make sure you discuss any questions you have with your health care provider. Document Released: 10/09/2009 Document Revised: 05/20/2016 Document Reviewed: 01/17/2015 Elsevier Interactive Patient Education  2017 [employer].

## 2023-03-22 ENCOUNTER — Telehealth: Payer: Self-pay | Admitting: Family Medicine

## 2023-03-22 NOTE — Telephone Encounter (Signed)
Called patient to schedule Medicare Annual Wellness Visit (AWV). Left message for patient to call back and schedule Medicare Annual Wellness Visit (AWV).  Last date of AWV: 01/01/2022   Please schedule an appointment at any time with either Mickel Baas or [place], [facility]. .  If any questions, please contact me at [phone].  Thank you ,  Thank you,  Colletta Maryland,  [place] Program Direct Dial ??[phone]

## 2023-04-04 ENCOUNTER — Telehealth: Payer: Self-pay | Admitting: Family Medicine

## 2023-04-04 NOTE — Telephone Encounter (Signed)
Called patient to schedule Medicare Annual Wellness Visit (AWV). Left message for patient to call back and schedule Medicare Annual Wellness Visit (AWV).  Last date of AWV: 01/01/2022   Please schedule an appointment at any time with Toni Amend, [facility] .  If any questions, please contact me at [phone].  Thank you,  Judeth Cornfield,  AMB Clinical Support [facility] AWV Program Direct Dial ??[phone]

## 2023-04-07 ENCOUNTER — Other Ambulatory Visit: Payer: Self-pay | Admitting: Family Medicine

## 2023-04-07 DIAGNOSIS — E039 Hypothyroidism, unspecified: Secondary | ICD-10-CM

## 2023-04-10 ENCOUNTER — Other Ambulatory Visit: Payer: Self-pay | Admitting: Family Medicine

## 2023-04-12 NOTE — Telephone Encounter (Signed)
Requested medications are due for refill today.  yes  Requested medications are on the active medications list.  yes  Last refill. 01/14/2023 #90 0 rf  Future visit scheduled.   no  Notes to clinic.  Labs are expired. Pt is over due for OV.    Requested Prescriptions  Pending Prescriptions Disp Refills   losartan (COZAAR) 100 MG tablet [Pharmacy Med Name: Losartan Potassium 100 MG Oral Tablet] 90 tablet 0    Sig: Take 1 tablet by mouth once daily     Cardiovascular:  Angiotensin Receptor Blockers Failed - 04/10/2023 12:07 PM      Failed - Cr in normal range and within 180 days    Creat  Date Value Ref Range Status  03/30/2021 1.45 (H) 0.60 - 0.88 mg/dL Final    Comment:    For patients >54 years of age, the reference limit for Creatinine is approximately 13% higher for people identified as African-American. .          Failed - K in normal range and within 180 days    Potassium  Date Value Ref Range Status  03/30/2021 4.2 3.5 - 5.3 mmol/L Final         Failed - Valid encounter within last 6 months    Recent Outpatient Visits           1 year ago Dysuria   [facility] Donita Brooks, MD   2 years ago Controlled type 2 diabetes mellitus without complication, without long-term current use of insulin (HCC)   [facility] Donita Brooks, MD   2 years ago Controlled type 2 diabetes mellitus without complication, without long-term current use of insulin (HCC)   [facility] Pickard, Priscille Heidelberg, MD   4 years ago Hematuria, unspecified type   [facility] Donita Brooks, MD   4 years ago Acute right ankle pain   [facility] Pickard, Priscille Heidelberg, MD              Passed - Patient is not pregnant      Passed - Last BP in normal range    BP Readings from Last 1 Encounters:  07/13/22 112/62

## 2023-04-20 ENCOUNTER — Other Ambulatory Visit: Payer: Self-pay | Admitting: Family Medicine

## 2023-04-29 ENCOUNTER — Other Ambulatory Visit: Payer: Self-pay | Admitting: Family Medicine

## 2023-04-29 ENCOUNTER — Telehealth: Payer: Self-pay

## 2023-04-29 MED ORDER — CEPHALEXIN 500 MG PO CAPS: 500.0000 mg | ORAL_CAPSULE | Freq: Three times a day (TID) | ORAL | 0 refills | Status: DC

## 2023-04-29 NOTE — Telephone Encounter (Signed)
Pt's daughter called in asking if pcp would send in a med to pharmacy for pt. Pt is stating that it burns when urinating. Pt's daughter states that pcp is aware that pt is unable to get out of house at this time. Please advise.  Cb#: [phone]

## 2023-07-01 ENCOUNTER — Ambulatory Visit: Payer: Self-pay

## 2023-07-01 ENCOUNTER — Other Ambulatory Visit: Payer: Self-pay | Admitting: Family Medicine

## 2023-07-01 DIAGNOSIS — E039 Hypothyroidism, unspecified: Secondary | ICD-10-CM

## 2023-07-01 NOTE — Telephone Encounter (Signed)
Chief Complaint: Urinary symptoms  Symptoms: Burning during urination and refusing to be changed during incontinence Frequency: Onset a couple days ago Pertinent Negatives: Patient denies frequent urination. Disposition: [] ED /[] Urgent Care (no appt availability in office) / [x] Appointment(In office/virtual)/ []  [place] [facility]/ [] Home Care/ [x] Refused Recommended Disposition /[] [place] [facility]/ []  Follow-up with PCP Additional Notes: Spoke to patient's daughter who stated her mom is having increased incontinence throughout the night, patient told daughter that it is burning with urination yesterday. The patient will not let daughter or other family members change her soiled underwear on a regular basis. Advised Pam, that her mom will need to be evaluated by the provider. Pam declined the appointment and requested an Antibiotic be sent to the pharmacy. Advised that I would forward the request to the provider at this time.    Reason for Disposition  Bad or foul-smelling urine  Answer Assessment - Initial Assessment Questions 1. SYMPTOM: "What's the main symptom you're concerned about?" (e.g., frequency, incontinence)     Incontinence throughout the night, strong odor 2. ONSET: "When did the  incontinence  start?"     Onset yesterday burning, odor a couple days ago  3. PAIN: "Is there any pain?" If Yes, ask: "How bad is it?" (Scale: 1-10; mild, moderate, severe)     Daughter is unsure  4. CAUSE: "What do you think is causing the symptoms?"     Incontinence  5. OTHER SYMPTOMS: "Do you have any other symptoms?" (e.g., blood in urine, fever, flank pain, pain with urination)     Burning when urinating  Protocols used: Urinary Symptoms-A-AH

## 2023-07-01 NOTE — Telephone Encounter (Signed)
Requested medication (s) are due for refill today: Yes  Requested medication (s) are on the active medication list: Yes  Last refill:  04/08/23  Future visit scheduled: No  Notes to clinic:  Unable to refill per protocol due to failed labs, no updated results, need appointment.     Requested Prescriptions  Pending Prescriptions Disp Refills   levothyroxine (SYNTHROID) 50 MCG tablet [Pharmacy Med Name: Levothyroxine Sodium 50 MCG Oral Tablet] 90 tablet 0    Sig: TAKE 1 TABLET BY MOUTH ONCE DAILY BEFORE BREAKFAST     Endocrinology:  Hypothyroid Agents Failed - 07/01/2023 10:08 AM      Failed - TSH in normal range and within 360 days    TSH  Date Value Ref Range Status  03/30/2021 4.00 0.40 - 4.50 mIU/L Final         Failed - Valid encounter within last 12 months    Recent Outpatient Visits           1 year ago Dysuria   [facility] Medicine Donita Brooks, MD   2 years ago Controlled type 2 diabetes mellitus without complication, without long-term current use of insulin (HCC)   [facility] Medicine Donita Brooks, MD   2 years ago Controlled type 2 diabetes mellitus without complication, without long-term current use of insulin (HCC)   [facility] Pickard, Priscille Heidelberg, MD   4 years ago Hematuria, unspecified type   [facility] Medicine Donita Brooks, MD   4 years ago Acute right ankle pain   [facility] Pickard, Priscille Heidelberg, MD

## 2023-07-01 NOTE — Telephone Encounter (Signed)
Called to offer an appointment for CPE. Daughter Pam stated that she is unable to bring the patient in for an appointment right now due to her upcoming appointments. She also stated that the patient refuses to go to appointments like the eye doctor right now. Advised that medication refill will be forwarded to provider for approval.

## 2023-07-04 ENCOUNTER — Other Ambulatory Visit: Payer: Self-pay | Admitting: Family Medicine

## 2023-07-04 MED ORDER — SULFAMETHOXAZOLE-TRIMETHOPRIM 800-160 MG PO TABS: 1.0000 | ORAL_TABLET | Freq: Two times a day (BID) | ORAL | 0 refills | Status: DC

## 2023-07-05 NOTE — Telephone Encounter (Signed)
Spoke w/Pamela Sharol Harness, per pt's DPR, is aware that Rx abx has been sent to pharmacy for pt. Already picked up.   Nothing further.

## 2023-07-13 ENCOUNTER — Other Ambulatory Visit: Payer: Self-pay | Admitting: Family Medicine

## 2023-07-14 ENCOUNTER — Ambulatory Visit: Payer: Medicare Other | Admitting: Family Medicine

## 2023-07-14 ENCOUNTER — Other Ambulatory Visit: Payer: Self-pay | Admitting: Family Medicine

## 2023-07-14 NOTE — Telephone Encounter (Signed)
Requested medication (s) are due for refill today:   Yes  Requested medication (s) are on the active medication list:   Yes  Future visit scheduled:   Yes today at 4:15 7/18   Last ordered: 04/12/2023 #90, 0 refills  Returned because labs are due and pt has an appt today with Dr. Tanya Nones.   Requested Prescriptions  Pending Prescriptions Disp Refills   losartan (COZAAR) 100 MG tablet [Pharmacy Med Name: Losartan Potassium 100 MG Oral Tablet] 90 tablet 0    Sig: Take 1 tablet by mouth once daily     Cardiovascular:  Angiotensin Receptor Blockers Failed - 07/13/2023 11:07 AM      Failed - Cr in normal range and within 180 days    Creat  Date Value Ref Range Status  03/30/2021 1.45 (H) 0.60 - 0.88 mg/dL Final    Comment:    For patients >37 years of age, the reference limit for Creatinine is approximately 13% higher for people identified as African-American. .          Failed - K in normal range and within 180 days    Potassium  Date Value Ref Range Status  03/30/2021 4.2 3.5 - 5.3 mmol/L Final         Failed - Valid encounter within last 6 months    Recent Outpatient Visits           1 year ago Dysuria   [facility] Donita Brooks, MD   2 years ago Controlled type 2 diabetes mellitus without complication, without long-term current use of insulin (HCC)   [facility] Medicine Donita Brooks, MD   2 years ago Controlled type 2 diabetes mellitus without complication, without long-term current use of insulin (HCC)   [facility] Medicine Pickard, Priscille Heidelberg, MD   4 years ago Hematuria, unspecified type   [facility] Donita Brooks, MD   4 years ago Acute right ankle pain   [facility] Pickard, Priscille Heidelberg, MD       Future Appointments             Today Pickard, Priscille Heidelberg, MD  [facility], [facility]            Passed - Patient is not pregnant      Passed - Last BP in normal  range    BP Readings from Last 1 Encounters:  07/13/22 112/62

## 2023-07-14 NOTE — Telephone Encounter (Addendum)
Please advise. See previous messages in thread.

## 2023-07-17 ENCOUNTER — Other Ambulatory Visit: Payer: Self-pay | Admitting: Family Medicine

## 2023-07-21 ENCOUNTER — Ambulatory Visit: Payer: Medicare Other | Admitting: Family Medicine

## 2023-07-24 ENCOUNTER — Other Ambulatory Visit: Payer: Self-pay | Admitting: Family Medicine

## 2023-07-25 ENCOUNTER — Ambulatory Visit: Payer: Medicare Other | Admitting: Family Medicine

## 2023-07-29 ENCOUNTER — Ambulatory Visit (INDEPENDENT_AMBULATORY_CARE_PROVIDER_SITE_OTHER): Payer: Medicare Other | Admitting: Family Medicine

## 2023-07-29 VITALS — BP 112/52 | HR 66 | Temp 97.4°F | Ht 62.0 in | Wt 123.4 lb

## 2023-07-29 DIAGNOSIS — R627 Adult failure to thrive: Secondary | ICD-10-CM

## 2023-07-29 DIAGNOSIS — R300 Dysuria: Secondary | ICD-10-CM

## 2023-07-29 LAB — URINALYSIS, ROUTINE W REFLEX MICROSCOPIC
Bilirubin Urine: NEGATIVE
Glucose, UA: NEGATIVE
Ketones, ur: NEGATIVE
Nitrite: NEGATIVE
Specific Gravity, Urine: 1.02 (ref 1.001–1.035)
pH: 7 (ref 5.0–8.0)

## 2023-07-29 LAB — MICROSCOPIC MESSAGE

## 2023-07-29 MED ORDER — CEPHALEXIN 500 MG PO CAPS: 500.0000 mg | ORAL_CAPSULE | Freq: Three times a day (TID) | ORAL | 0 refills | Status: DC

## 2023-07-29 NOTE — Progress Notes (Signed)
Subjective:    Patient ID: Taylor Sawyer, female    DOB: 10-22-1930, 89 y.o.   MRN: [id number]  Patient is a very frail 89 year-old Caucasian female with a history of COPD and dementia.  I treated the patient for urinary tract infection on July 8 over the telephone without obtaining a urine sample.  She was given Bactrim.  We did this because bringing the patient in is very difficult for the family  Family states that she is getting a bladder infection about every 2 weeks.  They base their diagnosis on her reported pain overnight on file on the urine, increasing confusion, and lethargy.  Usually is based on dysuria and foul odor.  Patient is sedentary and confined to wheelchair.  She is also incontinent of stool.  She is not drinking very much.  Therefore her living situation is a perfect set up for recurrent urinary tract infections. Past Medical History:  Diagnosis Date   Asthma    B12 deficiency    COPD (chronic obstructive pulmonary disease) (HCC)    Diabetes mellitus    Hyperlipidemia    Hypertension    Hypothyroidism    Memory loss    Tobacco user     Current Outpatient Medications on File Prior to Visit  Medication Sig Dispense Refill   albuterol (PROVENTIL HFA;VENTOLIN HFA) 108 (90 Base) MCG/ACT inhaler Inhale 2 puffs into the lungs every 6 (six) hours as needed for wheezing or shortness of breath. 1 Inhaler 0   cephALEXin (KEFLEX) 500 MG capsule Take 1 capsule (500 mg total) by mouth 3 (three) times daily. 21 capsule 0   cyanocobalamin (,VITAMIN B-12,) 1000 MCG/ML injection Inject  1 ml IM monthly (Patient not taking: Reported on 07/13/2022) 10 mL 2   fluticasone (FLONASE) 50 MCG/ACT nasal spray Place 2 sprays into both nostrils daily. 16 g 6   fluticasone (FLONASE) 50 MCG/ACT nasal spray Place 2 sprays into both nostrils daily. 16 g 6   Fluticasone-Umeclidin-Vilant (TRELEGY ELLIPTA) 100-62.5-25 MCG/INH AEPB Inhale 1 Inhaler into the lungs daily. 1 each 11   levothyroxine  (SYNTHROID) 50 MCG tablet TAKE 1 TABLET BY MOUTH ONCE DAILY BEFORE BREAKFAST 90 tablet 0   losartan (COZAAR) 100 MG tablet Take 1 tablet by mouth once daily 90 tablet 0   memantine (NAMENDA) 10 MG tablet Take 1 tablet (10 mg total) by mouth 2 (two) times daily. 180 tablet 3   simvastatin (ZOCOR) 20 MG tablet TAKE 1 TABLET BY MOUTH AT BEDTIME 90 tablet 0   sulfamethoxazole-trimethoprim (BACTRIM DS) 800-160 MG tablet Take 1 tablet by mouth 2 (two) times daily. 14 tablet 0   SYRINGE-NEEDLE, DISP, 3 ML (LUER LOCK SAFETY SYRINGES) 25G X 1" 3 ML MISC Use with B12 injections 50 each 1   No current facility-administered medications on file prior to visit.   Allergies  Allergen Reactions   Prednisone     Cramps.   Social History   Socioeconomic History   Marital status: Widowed    Spouse name: Not on file   Number of children: Not on file   Years of education: Not on file   Highest education level: Not on file  Occupational History   Not on file  Tobacco Use   Smoking status: Every Day   Smokeless tobacco: Never  Substance and Sexual Activity   Alcohol use: Never   Drug use: Never   Sexual activity: Not Currently  Other Topics Concern   Not on file  Social History  Narrative   Pt lived with daughter Rinaldo Cloud and son in law.    Social Determinants of Health   Financial Resource Strain: Low Risk  (01/01/2022)   Overall Financial Resource Strain (CARDIA)    Difficulty of Paying Living Expenses: Not hard at all  Food Insecurity: No Food Insecurity (01/01/2022)   Hunger Vital Sign    Worried About Running Out of Food in the Last Year: Never true    Ran Out of Food in the Last Year: Never true  Transportation Needs: No Transportation Needs (01/01/2022)   PRAPARE - Administrator, Civil Service (Medical): No    Lack of Transportation (Non-Medical): No  Physical Activity: Inactive (01/01/2022)   Exercise Vital Sign    Days of Exercise per Week: 0 days    Minutes of Exercise per  Session: 0 min  Stress: No Stress Concern Present (01/01/2022)   [employer] of Occupational Health - Occupational Stress Questionnaire    Feeling of Stress : Not at all  Social Connections: Socially Isolated (01/01/2022)   Social Connection and Isolation Panel [NHANES]    Frequency of Communication with Friends and Family: More than three times a week    Frequency of Social Gatherings with Friends and Family: More than three times a week    Attends Religious Services: Never    Database administrator or Organizations: No    Attends Banker Meetings: Never    Marital Status: Widowed  Intimate Partner Violence: Not At Risk (01/01/2022)   Humiliation, Afraid, Rape, and Kick questionnaire    Fear of Current or Ex-Partner: No    Emotionally Abused: No    Physically Abused: No    Sexually Abused: No     Review of Systems  All other systems reviewed and are negative.      Objective:   Physical Exam Vitals reviewed.  Constitutional:      General: She is not in acute distress.    Appearance: She is not ill-appearing or toxic-appearing.  HENT:     Left Ear: External ear normal.     Nose: Nose normal. No congestion or rhinorrhea.     Mouth/Throat:     Pharynx: No oropharyngeal exudate.  Eyes:     Conjunctiva/sclera: Conjunctivae normal.  Neck:     Vascular: No carotid bruit.  Cardiovascular:     Rate and Rhythm: Normal rate and regular rhythm.     Pulses: Normal pulses.     Heart sounds: Normal heart sounds. No murmur heard.    No friction rub. No gallop.  Pulmonary:     Effort: Pulmonary effort is normal. No respiratory distress.     Breath sounds: No wheezing, rhonchi or rales.  Abdominal:     General: Bowel sounds are normal. There is no distension.     Palpations: Abdomen is soft.     Tenderness: There is no abdominal tenderness. There is no guarding or rebound.  Musculoskeletal:     Cervical back: Neck supple.     Right lower leg: No edema.     Left  lower leg: No edema.     Right ankle: No proximal fibula tenderness.  Lymphadenopathy:     Cervical: No cervical adenopathy.  Skin:    General: Skin is warm.     Findings: No erythema or rash.  Neurological:     General: No focal deficit present.     Mental Status: She is alert.     Cranial Nerves: No  cranial nerve deficit.     Gait: Gait abnormal.  Psychiatric:        Attention and Perception: She is inattentive.        Mood and Affect: Affect is flat.        Speech: She is noncommunicative.        Behavior: Behavior is slowed.        Cognition and Memory: Cognition is impaired. Memory is impaired. She exhibits impaired recent memory and impaired remote memory.          Assessment & Plan:  Failure to thrive in adult - Plan: CBC with Differential/Platelet, COMPLETE METABOLIC PANEL WITH GFR, TSH, Vitamin B12  Dysuria - Plan: Urinalysis, Routine w reflex microscopic Patient has failure to thrive and dysuria.  Today urinalysis shows +3 leukocyte Estrace, trace blood, no nitrates.  Was the table odor.  Quad was insufficient for microscopy or urine culture.  I will treat the patient empirically with Keflex 500 mg 3 times daily for 7 days.  Obtain labs to look for reversible causes.  However I believe her failure to thrive is due to her advanced age.  My anticipation is that her latency is less than 6 months.  We discussed starting a prophylactic antibiotic to prevent frequent urinary tract infections.  We discussed the pros and cons of this.  The patient's family will consider this and await the results of the labs

## 2023-08-24 ENCOUNTER — Other Ambulatory Visit: Payer: Self-pay | Admitting: Family Medicine

## 2023-08-25 NOTE — Telephone Encounter (Signed)
Requested medication (s) are due for refill today - yes  Requested medication (s) are on the active medication list -yes  Future visit scheduled -no  Last refill: 08/09/22 #180 3RF  Notes to clinic: fails lab protocol- over 1 year-03/30/21  Requested Prescriptions  Pending Prescriptions Disp Refills   memantine (NAMENDA) 10 MG tablet [Pharmacy Med Name: Memantine HCl 10 MG Oral Tablet] 180 tablet 0    Sig: Take 1 tablet by mouth twice daily     Neurology:  Alzheimer's Agents 2 Failed - 08/24/2023  9:56 PM      Failed - Cr in normal range and within 360 days    Creat  Date Value Ref Range Status  03/30/2021 1.45 (H) 0.60 - 0.88 mg/dL Final    Comment:    For patients >39 years of age, the reference limit for Creatinine is approximately 13% higher for people identified as African-American. .          Failed - eGFR is 5 or above and within 360 days    GFR, Est African American  Date Value Ref Range Status  03/30/2021 37 (L) > OR = 60 mL/min/1.12m2 Final   GFR, Est Non African American  Date Value Ref Range Status  03/30/2021 32 (L) > OR = 60 mL/min/1.73m2 Final         Failed - Valid encounter within last 6 months    Recent Outpatient Visits           1 year ago Dysuria   [facility] Donita Brooks, MD   2 years ago Controlled type 2 diabetes mellitus without complication, without long-term current use of insulin (HCC)   [facility] Donita Brooks, MD   3 years ago Controlled type 2 diabetes mellitus without complication, without long-term current use of insulin (HCC)   [facility] Donita Brooks, MD   4 years ago Hematuria, unspecified type   [facility] Donita Brooks, MD   4 years ago Acute right ankle pain   [facility] Pickard, Priscille Heidelberg, MD                 Requested Prescriptions  Pending Prescriptions Disp Refills   memantine (NAMENDA) 10 MG tablet [Pharmacy  Med Name: Memantine HCl 10 MG Oral Tablet] 180 tablet 0    Sig: Take 1 tablet by mouth twice daily     Neurology:  Alzheimer's Agents 2 Failed - 08/24/2023  9:56 PM      Failed - Cr in normal range and within 360 days    Creat  Date Value Ref Range Status  03/30/2021 1.45 (H) 0.60 - 0.88 mg/dL Final    Comment:    For patients >45 years of age, the reference limit for Creatinine is approximately 13% higher for people identified as African-American. .          Failed - eGFR is 5 or above and within 360 days    GFR, Est African American  Date Value Ref Range Status  03/30/2021 37 (L) > OR = 60 mL/min/1.9m2 Final   GFR, Est Non African American  Date Value Ref Range Status  03/30/2021 32 (L) > OR = 60 mL/min/1.56m2 Final         Failed - Valid encounter within last 6 months    Recent Outpatient Visits           1 year ago Dysuria   [facility] Pickard, Priscille Heidelberg, MD   2 years ago Controlled type 2 diabetes mellitus without complication, without long-term current use of insulin (HCC)   [facility] Donita Brooks, MD   3 years ago Controlled type 2 diabetes mellitus without complication, without long-term current use of insulin (HCC)   [facility] Pickard, Priscille Heidelberg, MD   4 years ago Hematuria, unspecified type   [facility] Donita Brooks, MD   4 years ago Acute right ankle pain   [facility] Pickard, Priscille Heidelberg, MD

## 2023-09-09 ENCOUNTER — Telehealth: Payer: Self-pay | Admitting: Family Medicine

## 2023-09-09 ENCOUNTER — Ambulatory Visit: Payer: Self-pay

## 2023-09-09 NOTE — Telephone Encounter (Signed)
  Chief Complaint: urinary burning Symptoms: c/o burning with urination no other sx  Frequency: yesterday Pertinent Negatives: Patient denies flank pain, fever Disposition: [] ED /[] Urgent Care (no appt availability in office) / [] Appointment(In office/virtual)/ []  [facility]/ [] Home Care/ [] Refused Recommended Disposition /[] [facility]/ [x]  Follow-up with PCP Additional Notes: Pt refused UC and [facility] willl not treat post menopausal women for c/o urinary sx. Reached out to team leader and she was able to get PA. Heide Spark PA will call pt's daughter. Reason for Disposition  Age > 50 years  Answer Assessment - Initial Assessment Questions 1. SEVERITY: "How bad is the pain?"  (e.g., Scale 1-10; mild, moderate, or severe)   - MILD (1-3): complains slightly about urination hurting   - MODERATE (4-7): interferes with normal activities     - SEVERE (8-10): excruciating, unwilling or unable to urinate because of the pain      Dementia hurts with peeing unable to qualify 2. FREQUENCY: "How many times have you had painful urination today?"      *No Answer* 3. PATTERN: "Is pain present every time you urinate or just sometimes?"      *No Answer* 4. ONSET: "When did the painful urination start?"      Yesterday  5. FEVER: "Do you have a fever?" If Yes, ask: "What is your temperature, how was it measured, and when did it start?"     no 6. PAST UTI: "Have you had a urine infection before?" If Yes, ask: "When was the last time?" and "What happened that time?"      yes 7. CAUSE: "What do you think is causing the painful urination?"  (e.g., UTI, scratch, Herpes sore)     UTI 8. OTHER SYMPTOMS: "Do you have any other symptoms?" (e.g., blood in urine, flank pain, genital sores, urgency, vaginal discharge)     Denies pain  Protocols used: Urination Pain - Female-A-AH

## 2023-09-09 NOTE — Telephone Encounter (Signed)
Patient's daughter Elita Quick called to report patient is having sx of a UTI; burning and frequency. Advised patient to go to Urgent Care since we don't have a provider in the office at this time; Pam stated Dr. Tanya Nones told her she could just call to request a prescription when patient has UTI sx.  Pharmacy confirmed as:   [facility] [address], [place] - [address] [place] [address] Phone: [phone]  Fax: [phone]   Pam stated she will call back to make an appointment to bring in a urine specimen in the next few weeks.

## 2023-09-12 ENCOUNTER — Other Ambulatory Visit: Payer: Self-pay | Admitting: Family Medicine

## 2023-09-12 MED ORDER — SULFAMETHOXAZOLE-TRIMETHOPRIM 800-160 MG PO TABS: 1.0000 | ORAL_TABLET | Freq: Two times a day (BID) | ORAL | 0 refills | Status: DC

## 2023-09-26 ENCOUNTER — Other Ambulatory Visit: Payer: Self-pay | Admitting: Family Medicine

## 2023-09-26 DIAGNOSIS — E039 Hypothyroidism, unspecified: Secondary | ICD-10-CM

## 2023-09-27 NOTE — Telephone Encounter (Signed)
Requested medications are due for refill today.  yes  Requested medications are on the active medications list.  yes  Last refill. 07/01/2023 #90 0 rf  Future visit scheduled.   no  Notes to clinic.  Labs are expired.    Requested Prescriptions  Pending Prescriptions Disp Refills   levothyroxine (SYNTHROID) 50 MCG tablet [Pharmacy Med Name: Levothyroxine Sodium 50 MCG Oral Tablet] 90 tablet 0    Sig: TAKE 1 TABLET BY MOUTH ONCE DAILY BEFORE BREAKFAST     Endocrinology:  Hypothyroid Agents Failed - 09/26/2023 12:41 PM      Failed - TSH in normal range and within 360 days    TSH  Date Value Ref Range Status  03/30/2021 4.00 0.40 - 4.50 mIU/L Final         Failed - Valid encounter within last 12 months    Recent Outpatient Visits           2 years ago Dysuria   [facility] Donita Brooks, MD   2 years ago Controlled type 2 diabetes mellitus without complication, without long-term current use of insulin (HCC)   [facility] Donita Brooks, MD   3 years ago Controlled type 2 diabetes mellitus without complication, without long-term current use of insulin (HCC)   [facility] Pickard, Priscille Heidelberg, MD   4 years ago Hematuria, unspecified type   [facility] Donita Brooks, MD   4 years ago Acute right ankle pain   [facility] Pickard, Priscille Heidelberg, MD

## 2023-10-04 ENCOUNTER — Telehealth: Payer: Self-pay | Admitting: Family Medicine

## 2023-10-04 ENCOUNTER — Other Ambulatory Visit: Payer: Self-pay

## 2023-10-04 DIAGNOSIS — E039 Hypothyroidism, unspecified: Secondary | ICD-10-CM

## 2023-10-04 MED ORDER — LEVOTHYROXINE SODIUM 50 MCG PO TABS
50.0000 ug | ORAL_TABLET | Freq: Every day | ORAL | 1 refills | Status: DC
Start: 2023-10-04 — End: 2024-06-14

## 2023-10-04 NOTE — Telephone Encounter (Signed)
Already requested on 09/26/23 and routed to the office on 09/27/23, waiting approval.

## 2023-10-04 NOTE — Telephone Encounter (Signed)
Prescription Request  10/04/2023  LOV: 07/29/2023  What is the name of the medication or equipment? levothyroxine (SYNTHROID) 50 MCG tablet   Have you contacted your pharmacy to request a refill? Yes   Which pharmacy would you like this sent to?  [facility] [address], [place] - [address] [place] [address] Phone: [phone] Fax: [phone]    Patient notified that their request is being sent to the clinical staff for review and that they should receive a response within 2 business days.   Please advise at [facility] [phone]

## 2023-10-16 ENCOUNTER — Other Ambulatory Visit: Payer: Self-pay | Admitting: Family Medicine

## 2023-10-18 NOTE — Telephone Encounter (Signed)
Requested medication (s) are due for refill today: yes  Requested medication (s) are on the active medication list: yes  Last refill:  07/14/23  Future visit scheduled: no  Notes to clinic:  Unable to refill per protocol due to failed labs, no updated lipid results.      Requested Prescriptions  Pending Prescriptions Disp Refills   simvastatin (ZOCOR) 20 MG tablet [Pharmacy Med Name: Simvastatin 20 MG Oral Tablet] 90 tablet 0    Sig: TAKE 1 TABLET BY MOUTH AT BEDTIME     Cardiovascular:  Antilipid - Statins Failed - 10/16/2023  4:36 PM      Failed - Valid encounter within last 12 months    Recent Outpatient Visits           2 years ago Dysuria   [facility] Donita Brooks, MD   2 years ago Controlled type 2 diabetes mellitus without complication, without long-term current use of insulin (HCC)   [facility] Pickard, Priscille Heidelberg, MD   3 years ago Controlled type 2 diabetes mellitus without complication, without long-term current use of insulin (HCC)   [facility] Pickard, Priscille Heidelberg, MD   4 years ago Hematuria, unspecified type   [facility] Donita Brooks, MD   4 years ago Acute right ankle pain   [facility] Tanya Nones, Priscille Heidelberg, MD              Failed - Lipid Panel in normal range within the last 12 months    Cholesterol  Date Value Ref Range Status  03/30/2021 153 <200 mg/dL Final   LDL Cholesterol (Calc)  Date Value Ref Range Status  03/30/2021 67 mg/dL (calc) Final    Comment:    Reference range: <100 . Desirable range <100 mg/dL for primary prevention;   <70 mg/dL for patients with CHD or diabetic patients  with > or = 2 CHD risk factors. Marland Kitchen LDL-C is now calculated using the Martin-Hopkins  calculation, which is a validated novel method providing  better accuracy than the Friedewald equation in the  estimation of LDL-C.  Horald Pollen et al. Lenox Ahr. 2952;841(32): 2061-2068   (http://education.[employer].com/faq/FAQ164)    HDL  Date Value Ref Range Status  03/30/2021 68 > OR = 50 mg/dL Final   Triglycerides  Date Value Ref Range Status  03/30/2021 101 <150 mg/dL Final         Passed - Patient is not pregnant

## 2023-10-24 ENCOUNTER — Other Ambulatory Visit: Payer: Self-pay

## 2023-10-24 ENCOUNTER — Telehealth: Payer: Self-pay

## 2023-10-24 ENCOUNTER — Other Ambulatory Visit: Payer: Self-pay | Admitting: Family Medicine

## 2023-10-24 MED ORDER — SIMVASTATIN 20 MG PO TABS: 20.0000 mg | ORAL_TABLET | Freq: Every day | ORAL | 0 refills | Status: DC

## 2023-10-24 NOTE — Telephone Encounter (Signed)
Prescription Request  10/24/2023  LOV: 07/29/23  What is the name of the medication or equipment? simvastatin (ZOCOR) 20 MG tablet [[id number]]  Have you contacted your pharmacy to request a refill? Yes   Which pharmacy would you like this sent to?  [facility] [address], [place] - [address] [place] [address] Phone: [phone] Fax: [phone]    Patient notified that their request is being sent to the clinical staff for review and that they should receive a response within 2 business days.   Please advise at [facility] [phone]

## 2023-10-24 NOTE — Telephone Encounter (Signed)
Pt's daughter called in to ask if pcp would send in this antibiotic cephALEXin (KEFLEX) 500. Pt's daughter stated that she will be able to bring in pt to see pcp next week to give a urine sample. Pt's daughter stated that she is asking for this antibiotic as the pt tolerates this med much better. Pt is still having some burning with urination. Please advise.  Cb#: [phone]

## 2023-10-25 ENCOUNTER — Other Ambulatory Visit: Payer: Self-pay | Admitting: Family Medicine

## 2023-10-25 MED ORDER — CEPHALEXIN 500 MG PO CAPS: 500.0000 mg | ORAL_CAPSULE | Freq: Three times a day (TID) | ORAL | 0 refills | Status: DC

## 2023-11-10 ENCOUNTER — Other Ambulatory Visit: Payer: Self-pay | Admitting: Family Medicine

## 2023-11-11 NOTE — Telephone Encounter (Signed)
Requested Prescriptions  Pending Prescriptions Disp Refills   losartan (COZAAR) 100 MG tablet [Pharmacy Med Name: Losartan Potassium 100 MG Oral Tablet] 90 tablet 0    Sig: Take 1 tablet by mouth once daily     Cardiovascular:  Angiotensin Receptor Blockers Failed - 11/10/2023  3:26 PM      Failed - Cr in normal range and within 180 days    Creat  Date Value Ref Range Status  03/30/2021 1.45 (H) 0.60 - 0.88 mg/dL Final    Comment:    For patients >34 years of age, the reference limit for Creatinine is approximately 13% higher for people identified as African-American. .          Failed - K in normal range and within 180 days    Potassium  Date Value Ref Range Status  03/30/2021 4.2 3.5 - 5.3 mmol/L Final         Failed - Valid encounter within last 6 months    Recent Outpatient Visits           2 years ago Dysuria   [facility] Donita Brooks, MD   2 years ago Controlled type 2 diabetes mellitus without complication, without long-term current use of insulin (HCC)   [facility] Donita Brooks, MD   3 years ago Controlled type 2 diabetes mellitus without complication, without long-term current use of insulin (HCC)   [facility] Pickard, Priscille Heidelberg, MD   4 years ago Hematuria, unspecified type   [facility] Donita Brooks, MD   4 years ago Acute right ankle pain   [facility] Pickard, Priscille Heidelberg, MD              Passed - Patient is not pregnant      Passed - Last BP in normal range    BP Readings from Last 1 Encounters:  07/29/23 (!) 112/52

## 2023-11-14 ENCOUNTER — Other Ambulatory Visit: Payer: Self-pay | Admitting: Family Medicine

## 2023-11-29 ENCOUNTER — Ambulatory Visit: Payer: Medicare Other | Admitting: Family Medicine

## 2024-01-18 ENCOUNTER — Other Ambulatory Visit: Payer: Self-pay | Admitting: Family Medicine

## 2024-01-18 NOTE — Telephone Encounter (Signed)
Requested medication (s) are due for refill today: Yes  Requested medication (s) are on the active medication list: Yes  Last refill:  10/24/23  Future visit scheduled: No, last OV 07/29/23  Notes to clinic:  Unable to refill per protocol due to failed labs, no updated results.      Requested Prescriptions  Pending Prescriptions Disp Refills   simvastatin (ZOCOR) 20 MG tablet [Pharmacy Med Name: Simvastatin 20 MG Oral Tablet] 90 tablet 0    Sig: TAKE 1 TABLET BY MOUTH AT BEDTIME     Cardiovascular:  Antilipid - Statins Failed - 01/18/2024  2:25 PM      Failed - Valid encounter within last 12 months    Recent Outpatient Visits           2 years ago Dysuria   [facility] Donita Brooks, MD   2 years ago Controlled type 2 diabetes mellitus without complication, without long-term current use of insulin (HCC)   [facility] Pickard, Priscille Heidelberg, MD   3 years ago Controlled type 2 diabetes mellitus without complication, without long-term current use of insulin (HCC)   [facility] Pickard, Priscille Heidelberg, MD   5 years ago Hematuria, unspecified type   [facility] Donita Brooks, MD   5 years ago Acute right ankle pain   [facility] Tanya Nones, Priscille Heidelberg, MD              Failed - Lipid Panel in normal range within the last 12 months    Cholesterol  Date Value Ref Range Status  03/30/2021 153 <200 mg/dL Final   LDL Cholesterol (Calc)  Date Value Ref Range Status  03/30/2021 67 mg/dL (calc) Final    Comment:    Reference range: <100 . Desirable range <100 mg/dL for primary prevention;   <70 mg/dL for patients with CHD or diabetic patients  with > or = 2 CHD risk factors. Marland Kitchen LDL-C is now calculated using the Martin-Hopkins  calculation, which is a validated novel method providing  better accuracy than the Friedewald equation in the  estimation of LDL-C.  Horald Pollen et al. Lenox Ahr. 4098;119(14):  2061-2068  (http://education.[facility].com/faq/FAQ164)    HDL  Date Value Ref Range Status  03/30/2021 68 > OR = 50 mg/dL Final   Triglycerides  Date Value Ref Range Status  03/30/2021 101 <150 mg/dL Final         Passed - Patient is not pregnant

## 2024-01-24 ENCOUNTER — Other Ambulatory Visit: Payer: Self-pay | Admitting: Family Medicine

## 2024-01-24 MED ORDER — SIMVASTATIN 20 MG PO TABS: 20.0000 mg | ORAL_TABLET | Freq: Every day | ORAL | 0 refills | Status: DC

## 2024-01-24 NOTE — Telephone Encounter (Signed)
Copied from CRM [id number]. Topic: Clinical - Medication Refill >> Jan 24, 2024  2:39 PM Taylor Sawyer wrote: Most Recent Primary Care Visit:  Provider: Lynnea Ferrier Sawyer  Department: [facility]  Visit Type: SAME DAY  Date: 07/29/2023  Medication: simvastatin (ZOCOR) 20 MG tablet   Has the patient contacted their pharmacy? Yes Pharmacy advised for the patient to reach out to her PCP.  Is this the correct pharmacy for this prescription? Yes  This is the patient's preferred pharmacy:  [facility] [address] [place] - [address] [place] [address] Phone: [phone] Fax: [phone]   Has the prescription been filled recently? No  Is the patient out of the medication? Yes  Has the patient been seen for an appointment in the last year OR does the patient have an upcoming appointment? Yes  Can we respond through MyChart? No  Agent: Please be advised that Rx refills may take up to 3 business days. We ask that you follow-up with your pharmacy.

## 2024-02-12 ENCOUNTER — Other Ambulatory Visit: Payer: Self-pay | Admitting: Family Medicine

## 2024-02-14 ENCOUNTER — Other Ambulatory Visit: Payer: Self-pay | Admitting: Family Medicine

## 2024-02-14 NOTE — Telephone Encounter (Signed)
Requested medication (s) are due for refill today: Yes  Requested medication (s) are on the active medication list: Yes  Last refill:  11/14/23  Future visit scheduled: No  Notes to clinic:  Unable to refill per protocol due to failed labs, no updated results.      Requested Prescriptions  Pending Prescriptions Disp Refills   memantine (NAMENDA) 10 MG tablet [Pharmacy Med Name: Memantine HCl 10 MG Oral Tablet] 180 tablet 0    Sig: Take 1 tablet by mouth twice daily     Neurology:  Alzheimer's Agents 2 Failed - 02/14/2024 12:00 PM      Failed - Cr in normal range and within 360 days    Creat  Date Value Ref Range Status  03/30/2021 1.45 (H) 0.60 - 0.88 mg/dL Final    Comment:    For patients >51 years of age, the reference limit for Creatinine is approximately 13% higher for people identified as African-American. .          Failed - eGFR is 5 or above and within 360 days    GFR, Est African American  Date Value Ref Range Status  03/30/2021 37 (L) > OR = 60 mL/min/1.29m2 Final   GFR, Est Non African American  Date Value Ref Range Status  03/30/2021 32 (L) > OR = 60 mL/min/1.32m2 Final         Failed - Valid encounter within last 6 months    Recent Outpatient Visits           2 years ago Dysuria   [facility] Donita Brooks, MD   2 years ago Controlled type 2 diabetes mellitus without complication, without long-term current use of insulin (HCC)   [facility] Donita Brooks, MD   3 years ago Controlled type 2 diabetes mellitus without complication, without long-term current use of insulin (HCC)   [facility] Pickard, Priscille Heidelberg, MD   5 years ago Hematuria, unspecified type   [facility] Donita Brooks, MD   5 years ago Acute right ankle pain   [facility] Pickard, Priscille Heidelberg, MD

## 2024-02-28 ENCOUNTER — Other Ambulatory Visit: Payer: Self-pay | Admitting: Family Medicine

## 2024-02-28 ENCOUNTER — Ambulatory Visit: Payer: Self-pay | Admitting: Family Medicine

## 2024-02-28 MED ORDER — OSELTAMIVIR PHOSPHATE 75 MG PO CAPS: 75.0000 mg | ORAL_CAPSULE | Freq: Two times a day (BID) | ORAL | 0 refills | Status: AC

## 2024-02-28 NOTE — Telephone Encounter (Signed)
  Chief Complaint: Flu exposure-both daughter and son in law have been sick with the flu.  Symptoms: cough and head congestion Frequency: 3-4 days Pertinent Negatives: Patient denies fever, Disposition: [] ED /[] Urgent Care (no appt availability in office) / [] Appointment(In office/virtual)/ []  [facility]/ [] Home Care/ [] Refused Recommended Disposition /[] [facility]/ [x]  Follow-up with PCP Additional Notes: family calling with concerns that patient may have come down with the flu. Son in law on the phone states that patient has been coughing and has head congestions for the past three-four days. Both daughter and son in law have been sick with the flu. Family is wanting medication sent into the pharmacy. Son in law states that it is quite difficulty getting patient to the office. Asking for a phone call at [phone] which is daughter Pamela's number. Verbalized understanding and all questions answered.    Copied from CRM [id number]. Topic: Clinical - Medical Advice >> Feb 28, 2024  1:51 PM Everette C wrote: Reason for CRM: The patient has experienced flu like symptoms for the past 3/4 days   The patient has been in close contact with family members that have been in the hospital   The patient would like to be prescribed something for their cough and congestion Reason for Disposition  [1] Influenza EXPOSURE (Close Contact) within last 48 hours (2 days) AND [2] exposed person is HIGH RISK (e.g., age > 64 years, pregnant, HIV+, chronic medical condition)  Answer Assessment - Initial Assessment Questions 1. TYPE of EXPOSURE: "How were you exposed?" (e.g., close contact, not a close contact)     Close contact 2. DATE of EXPOSURE: "When did the exposure occur?" (e.g., hour, days, weeks)     Symptoms have been going on for a couple of days 4. HIGH RISK for COMPLICATIONS: "Do you have any heart or lung problems?" "Do you have a weakened immune system?" (e.g., CHF, COPD, asthma,  HIV positive, chemotherapy, renal failure, diabetes mellitus, sickle cell anemia)     Close family members have tested positive for flu and family is concerned. 5. SYMPTOMS: "Do you have any symptoms?" (e.g., cough, fever, sore throat, difficulty breathing).     Cough and congestion.  Protocols used: Influenza (Flu) Exposure-A-AH

## 2024-02-29 ENCOUNTER — Other Ambulatory Visit: Payer: Self-pay | Admitting: Family Medicine

## 2024-03-05 ENCOUNTER — Ambulatory Visit: Payer: Self-pay | Admitting: Family Medicine

## 2024-03-05 NOTE — Telephone Encounter (Signed)
  Chief Complaint: vaginal discharge; burning with urinating  Symptoms: discharge and burning   Disposition: [] ED /[] Urgent Care (no appt availability in office) / [x] Appointment(In office/virtual)/ []  [facility]/ [] Home Care/ [] Refused Recommended Disposition /[] [place] [facility]/ []  Follow-up with PCP Additional Notes: Pamale, DPR, called asking for something for thick white vaginal discharge that smells foul. Pt also told Rinaldo Cloud it burns when urinating. Rinaldo Cloud is unable to get pt to appt today. Rinaldo Cloud is asking for medication to help with this. Rinaldo Cloud also has questions about setting up blood work. Please advise as soon as possible.   ** Call Maryelizabeth Rowan at [phone]**           Copied from CRM [id number]. Topic: Clinical - Red Word Triage >> Mar 05, 2024  2:35 PM Ivette P wrote: Red Word that prompted transfer to Nurse Triage: dr pickard called soemthing in for cold antibiotic. not sure if added to it or what. now has a UTI. nothing but mucus in her pad. burnt. still has a cough. issues with kidney. two showers back to back. using bathroom on herself. Reason for Disposition  Bad smelling vaginal discharge  Answer Assessment - Initial Assessment Questions 1. DISCHARGE: "Describe the discharge." (e.g., white, yellow, green, gray, foamy, cottage cheese-like)     White, thick discharge  2. ODOR: "Is there a bad odor?"     yes 3. ONSET: "When did the discharge begin?"     yesterday  5. ABDOMEN PAIN: "Are you having any abdomen pain?" If Yes, ask: "What does it feel like? " (e.g., crampy, dull, intermittent, constant)      Denies  6. ABDOMEN PAIN SEVERITY: If present, ask: "How bad is it?" (e.g., Scale 1-10; mild, moderate, or severe)   - MILD (1-3): Doesn't interfere with normal activities, abdomen soft and not tender to touch.    - MODERATE (4-7): Interferes with normal activities or awakens from sleep, abdomen tender to touch.    - SEVERE (8-10):  Excruciating pain, doubled over, unable to do any normal activities. (R/O peritonitis)      Denies  7. CAUSE: "What do you think is causing the discharge?" "Have you had the same problem before? What happened then?"     Not sure 8. OTHER SYMPTOMS: "Do you have any other symptoms?" (e.g., fever, itching, vaginal bleeding, pain with urination, injury to genital area, vaginal foreign body)     Burns during urinating  Protocols used: Vaginal Discharge-A-AH

## 2024-03-06 ENCOUNTER — Other Ambulatory Visit: Payer: Self-pay | Admitting: Family Medicine

## 2024-03-06 ENCOUNTER — Other Ambulatory Visit

## 2024-03-06 DIAGNOSIS — R300 Dysuria: Secondary | ICD-10-CM

## 2024-03-06 MED ORDER — CEPHALEXIN 500 MG PO CAPS: 500.0000 mg | ORAL_CAPSULE | Freq: Three times a day (TID) | ORAL | 0 refills | Status: DC

## 2024-03-07 LAB — URINALYSIS, ROUTINE W REFLEX MICROSCOPIC
Bilirubin Urine: NEGATIVE
Glucose, UA: NEGATIVE
Ketones, ur: NEGATIVE
Nitrite: NEGATIVE
Protein, ur: NEGATIVE
Specific Gravity, Urine: 1.001 (ref 1.001–1.035)
pH: 8.5 — ABNORMAL HIGH (ref 5.0–8.0)

## 2024-03-07 LAB — MICROSCOPIC MESSAGE

## 2024-03-09 LAB — URINE CULTURE
MICRO NUMBER:: 16186498
SPECIMEN QUALITY:: ADEQUATE

## 2024-03-30 ENCOUNTER — Other Ambulatory Visit: Payer: Self-pay | Admitting: Family Medicine

## 2024-03-30 DIAGNOSIS — E039 Hypothyroidism, unspecified: Secondary | ICD-10-CM

## 2024-03-30 NOTE — Telephone Encounter (Signed)
 Requested medication (s) are due for refill today: yes  Requested medication (s) are on the active medication list: yes  Last refill:  10/24(/24  Future visit scheduled: no  Notes to clinic:  Unable to refill per protocol due to failed labs, no updated  tsh results.      Requested Prescriptions  Pending Prescriptions Disp Refills   levothyroxine (SYNTHROID) 50 MCG tablet [Pharmacy Med Name: Levothyroxine Sodium 50 MCG Oral Tablet] 90 tablet 0    Sig: TAKE 1 TABLET BY MOUTH ONCE DAILY BEFORE BREAKFAST     Endocrinology:  Hypothyroid Agents Failed - 03/30/2024  4:18 PM      Failed - TSH in normal range and within 360 days    TSH  Date Value Ref Range Status  03/30/2021 4.00 0.40 - 4.50 mIU/L Final         Failed - Valid encounter within last 12 months    Recent Outpatient Visits           8 months ago Failure to thrive in adult    [facility] Pickard, Priscille Heidelberg, MD   1 year ago Controlled type 2 diabetes mellitus without complication, without long-term current use of insulin [facility])    [facility] Pickard, Priscille Heidelberg, MD

## 2024-04-20 ENCOUNTER — Other Ambulatory Visit: Payer: Self-pay | Admitting: Family Medicine

## 2024-04-20 NOTE — Telephone Encounter (Signed)
 Requested medication (s) are due for refill today: Yes  Requested medication (s) are on the active medication list: Yes  Last refill:  01/24/24,#30, 0 refills  Future visit scheduled: No  Notes to clinic:  Unable to refill per protocol due to failed labs, no updated results.      Requested Prescriptions  Pending Prescriptions Disp Refills   simvastatin  (ZOCOR ) 20 MG tablet [Pharmacy Med Name: Simvastatin  20 MG Oral Tablet] 90 tablet 0    Sig: TAKE 1 TABLET BY MOUTH AT BEDTIME     Cardiovascular:  Antilipid - Statins Failed - 04/20/2024  3:52 PM      Failed - Valid encounter within last 12 months    Recent Outpatient Visits           8 months ago Failure to thrive in adult   [facility] Pickard, Cisco Crest, MD   1 year ago Controlled type 2 diabetes mellitus without complication, without long-term current use of insulin [facility])   [facility] Austine Lefort, MD              Failed - Lipid Panel in normal range within the last 12 months    Cholesterol  Date Value Ref Range Status  03/30/2021 153 <200 mg/dL Final   LDL Cholesterol (Calc)  Date Value Ref Range Status  03/30/2021 67 mg/dL (calc) Final    Comment:    Reference range: <100 . Desirable range <100 mg/dL for primary prevention;   <70 mg/dL for patients with CHD or diabetic patients  with > or = 2 CHD risk factors. Aaron Aas LDL-C is now calculated using the Martin-Hopkins  calculation, which is a validated novel method providing  better accuracy than the Friedewald equation in the  estimation of LDL-C.  Melinda Sprawls et al. Erroll Heard. 6578;469(62): 2061-2068  (http://education.[employer].com/faq/FAQ164)    HDL  Date Value Ref Range Status  03/30/2021 68 > OR = 50 mg/dL Final   Triglycerides  Date Value Ref Range Status  03/30/2021 101 <150 mg/dL Final         Passed - Patient is not pregnant

## 2024-05-14 ENCOUNTER — Telehealth: Payer: Self-pay

## 2024-05-14 ENCOUNTER — Other Ambulatory Visit: Payer: Self-pay | Admitting: Family Medicine

## 2024-05-14 MED ORDER — CEPHALEXIN 500 MG PO CAPS: 500.0000 mg | ORAL_CAPSULE | Freq: Three times a day (TID) | ORAL | 0 refills | Status: AC

## 2024-05-14 NOTE — Telephone Encounter (Signed)
 Copied from CRM [id number]. Topic: Clinical - Prescription Issue >> May 14, 2024  2:05 PM Taylor Sawyer wrote: Reason for CRM: Pt's daughter Taylor Sawyer ph: [phone] called stated pt has a UTI and is requesting antibiotics sent to pharmacy [facility] [address] [place] [address] Phone: [phone] Fax: [phone].

## 2024-06-08 ENCOUNTER — Other Ambulatory Visit: Payer: Self-pay | Admitting: Family Medicine

## 2024-06-08 DIAGNOSIS — E039 Hypothyroidism, unspecified: Secondary | ICD-10-CM

## 2024-06-13 ENCOUNTER — Telehealth: Payer: Self-pay

## 2024-06-13 NOTE — Telephone Encounter (Signed)
 Copied from CRM [id number]. Topic: Clinical - Medication Question >> Jun 13, 2024  4:06 PM Adonis Hoot wrote: Reason for CRM: Patient is scheduled for OV in July. Daughter would like to know if she could have a few of her thyroid  pills prescribed until she makes it to that appointment?

## 2024-06-13 NOTE — Telephone Encounter (Signed)
 Copied from CRM [id number]. Topic: Clinical - Prescription Issue >> Jun 12, 2024  3:56 PM Crispin Dolphin wrote: Reason for CRM: Dina Francisco called to request refill levothyroxine  (SYNTHROID ) 50 MCG tablet. Already reached out to pharmacy. Pharmacy request is in 6/13 denied due to patient needs appt. Caller states she is not able to come in for appt. She can't walk and has dementia and she would like to know what else she can do.  Tennis Feinstein (Self) [phone] Thank You

## 2024-06-14 ENCOUNTER — Other Ambulatory Visit: Payer: Self-pay

## 2024-06-14 DIAGNOSIS — E039 Hypothyroidism, unspecified: Secondary | ICD-10-CM

## 2024-06-14 MED ORDER — LEVOTHYROXINE SODIUM 50 MCG PO TABS: 50.0000 ug | ORAL_TABLET | Freq: Every day | ORAL | 1 refills | Status: AC

## 2024-07-03 ENCOUNTER — Ambulatory Visit: Admitting: Family Medicine

## 2024-07-18 ENCOUNTER — Other Ambulatory Visit: Payer: Self-pay | Admitting: Family Medicine

## 2024-07-20 ENCOUNTER — Ambulatory Visit: Admitting: Family Medicine

## 2024-07-30 ENCOUNTER — Ambulatory Visit: Admitting: Family Medicine

## 2024-08-04 ENCOUNTER — Other Ambulatory Visit: Payer: Self-pay | Admitting: Family Medicine

## 2024-10-13 ENCOUNTER — Other Ambulatory Visit: Payer: Self-pay | Admitting: Family Medicine

## 2024-10-31 ENCOUNTER — Other Ambulatory Visit: Payer: Self-pay | Admitting: Family Medicine

## 2024-12-05 ENCOUNTER — Other Ambulatory Visit: Payer: Self-pay | Admitting: Family Medicine

## 2024-12-05 DIAGNOSIS — E039 Hypothyroidism, unspecified: Secondary | ICD-10-CM

## 2024-12-07 ENCOUNTER — Other Ambulatory Visit: Payer: Self-pay | Admitting: Family Medicine

## 2024-12-07 DIAGNOSIS — E039 Hypothyroidism, unspecified: Secondary | ICD-10-CM

## 2024-12-07 NOTE — Telephone Encounter (Signed)
 Copied from CRM #[id number]. Topic: Clinical - Medication Refill >> Dec 07, 2024  1:25 PM Zebedee SAUNDERS wrote: Medication: levothyroxine  (SYNTHROID ) 50 MCG tablet  Has the patient contacted their pharmacy? Yes (Agent: If no, request that the patient contact the pharmacy for the refill. If patient does not wish to contact the pharmacy document the reason why and proceed with request.) (Agent: If yes, when and what did the pharmacy advise?)  This is the patient's preferred pharmacy:  [facility] [address], [place] - [address] [place] [address] Phone: [phone] Fax: [phone]  Is this the correct pharmacy for this prescription? Yes If no, delete pharmacy and type the correct one.   Has the prescription been filled recently? Yes  Is the patient out of the medication? Yes  Has the patient been seen for an appointment in the last year OR does the patient have an upcoming appointment? Yes  Can we respond through MyChart? Yes  Agent: Please be advised that Rx refills may take up to 3 business days. We ask that you follow-up with your pharmacy.

## 2024-12-10 NOTE — Telephone Encounter (Signed)
 Requested medication (s) are due for refill today: yes  Requested medication (s) are on the active medication list: yes  Last refill:  06/14/24  Future visit scheduled: no  Notes to clinic:  Unable to refill per protocol, courtesy refill already given, routing for provider approval.      Requested Prescriptions  Pending Prescriptions Disp Refills   levothyroxine  (SYNTHROID ) 50 MCG tablet 90 tablet 1    Sig: Take 1 tablet (50 mcg total) by mouth daily before breakfast.     Endocrinology:  Hypothyroid Agents Failed - 12/10/2024  4:05 PM      Failed - TSH in normal range and within 360 days    TSH  Date Value Ref Range Status  03/30/2021 4.00 0.40 - 4.50 mIU/L Final         Failed - Valid encounter within last 12 months    Recent Outpatient Visits           1 year ago Failure to thrive in adult   [facility] Pickard, Butler DASEN, MD   2 years ago Controlled type 2 diabetes mellitus without complication, without long-term current use of insulin [facility])   [facility] Pickard, Butler DASEN, MD

## 2025-01-12 ENCOUNTER — Other Ambulatory Visit: Payer: Self-pay | Admitting: Family Medicine

## 2025-01-23 ENCOUNTER — Other Ambulatory Visit: Payer: Self-pay | Admitting: Family Medicine

## 2025-01-23 DIAGNOSIS — E039 Hypothyroidism, unspecified: Secondary | ICD-10-CM

## 2025-02-01 ENCOUNTER — Other Ambulatory Visit: Payer: Self-pay | Admitting: Family Medicine
